# Patient Record
Sex: Female | Born: 1990 | Hispanic: Yes | Marital: Single | Smoking: Former smoker
Health system: Southern US, Community
[De-identification: ages and names within clinical notes are randomized; demographics above are authoritative.]

## PROBLEM LIST (undated history)

## (undated) ENCOUNTER — Inpatient Hospital Stay (HOSPITAL_COMMUNITY): Payer: Self-pay

## (undated) DIAGNOSIS — R87629 Unspecified abnormal cytological findings in specimens from vagina: Secondary | ICD-10-CM

## (undated) DIAGNOSIS — D649 Anemia, unspecified: Secondary | ICD-10-CM

## (undated) DIAGNOSIS — Z550 Illiteracy and low-level literacy: Secondary | ICD-10-CM

## (undated) DIAGNOSIS — Z559 Problems related to education and literacy, unspecified: Secondary | ICD-10-CM

## (undated) HISTORY — PX: NO PAST SURGERIES: SHX2092

## (undated) HISTORY — DX: Unspecified abnormal cytological findings in specimens from vagina: R87.629

## (undated) HISTORY — DX: Anemia, unspecified: D64.9

## (undated) HISTORY — DX: Problems related to education and literacy, unspecified: Z55.9

## (undated) HISTORY — DX: Illiteracy and low-level literacy: Z55.0

---

## 2012-10-04 NOTE — L&D Delivery Note (Signed)
Delivery Note At 3:14 AM a viable female was delivered via Vaginal, Spontaneous Delivery, cephalic presentation. Membranes spontaneously ruptured with delivery- clear fluid. APGAR: 9,9, weight pending Placenta status: delivered intact. 3 vessel Cord. With no complications: .Cord pH- not collected.  Anesthesia:  n/a Episiotomy: n/a Lacerations: none Suture Repair: n/a Est. Blood Loss (mL): 250  Mom to postpartum.  Baby to nursery-stable.  Anselm Lis 07/14/2013, 3:39 AM  I have seen and examined this patient and I agree with the above. Was present for delivery. Alexandr Oehler 4:18 AM 07/14/2013

## 2012-12-20 ENCOUNTER — Encounter (HOSPITAL_COMMUNITY): Payer: Self-pay

## 2012-12-20 ENCOUNTER — Emergency Department (HOSPITAL_COMMUNITY): Payer: Self-pay

## 2012-12-20 ENCOUNTER — Emergency Department (HOSPITAL_COMMUNITY)
Admission: EM | Admit: 2012-12-20 | Discharge: 2012-12-20 | Disposition: A | Discharge status: Home / Self Care | Payer: Self-pay | Attending: Emergency Medicine | Admitting: Emergency Medicine

## 2012-12-20 DIAGNOSIS — O210 Mild hyperemesis gravidarum: Secondary | ICD-10-CM | POA: Insufficient documentation

## 2012-12-20 DIAGNOSIS — E860 Dehydration: Secondary | ICD-10-CM | POA: Insufficient documentation

## 2012-12-20 DIAGNOSIS — R109 Unspecified abdominal pain: Secondary | ICD-10-CM | POA: Insufficient documentation

## 2012-12-20 DIAGNOSIS — N760 Acute vaginitis: Secondary | ICD-10-CM | POA: Insufficient documentation

## 2012-12-20 DIAGNOSIS — R51 Headache: Secondary | ICD-10-CM | POA: Insufficient documentation

## 2012-12-20 DIAGNOSIS — O9989 Other specified diseases and conditions complicating pregnancy, childbirth and the puerperium: Secondary | ICD-10-CM | POA: Insufficient documentation

## 2012-12-20 DIAGNOSIS — R509 Fever, unspecified: Secondary | ICD-10-CM | POA: Insufficient documentation

## 2012-12-20 DIAGNOSIS — O98819 Other maternal infectious and parasitic diseases complicating pregnancy, unspecified trimester: Secondary | ICD-10-CM | POA: Insufficient documentation

## 2012-12-20 DIAGNOSIS — R112 Nausea with vomiting, unspecified: Secondary | ICD-10-CM

## 2012-12-20 LAB — WET PREP, GENITAL: Trich, Wet Prep: NONE SEEN

## 2012-12-20 LAB — URINALYSIS, ROUTINE W REFLEX MICROSCOPIC
Ketones, ur: >80 mg/dL — AB
Nitrite: NEGATIVE
Specific Gravity, Urine: 1.027 (ref 1.005–1.030)
pH: 6 (ref 5.0–8.0)

## 2012-12-20 LAB — CBC WITH DIFFERENTIAL/PLATELET
Basophils Absolute: 0 10*3/uL (ref 0.0–0.1)
Basophils Relative: 0 % (ref 0–1)
Hemoglobin: 11.6 g/dL — ABNORMAL LOW (ref 12.0–15.0)
MCHC: 33.9 g/dL (ref 30.0–36.0)
Monocytes Relative: 6 % (ref 3–12)
Neutro Abs: 7.5 10*3/uL (ref 1.7–7.7)
Neutrophils Relative %: 72 % (ref 43–77)
RDW: 18.3 % — ABNORMAL HIGH (ref 11.5–15.5)
WBC: 10.4 10*3/uL (ref 4.0–10.5)

## 2012-12-20 LAB — BASIC METABOLIC PANEL
CO2: 21 mEq/L (ref 19–32)
Calcium: 9 mg/dL (ref 8.4–10.5)
Creatinine, Ser: 0.41 mg/dL — ABNORMAL LOW (ref 0.50–1.10)
Glucose, Bld: 90 mg/dL (ref 70–99)

## 2012-12-20 LAB — URINE MICROSCOPIC-ADD ON

## 2012-12-20 MED ORDER — ACETAMINOPHEN 325 MG PO TABS
650.0000 mg | ORAL_TABLET | Freq: Once | ORAL | Status: AC
  Administered 2012-12-20: 650 mg via ORAL
  Filled 2012-12-20: qty 2

## 2012-12-20 MED ORDER — ONDANSETRON 4 MG PO TBDP
8.0000 mg | ORAL_TABLET | Freq: Once | ORAL | Status: AC
  Administered 2012-12-20: 8 mg via ORAL
  Filled 2012-12-20: qty 2

## 2012-12-20 MED ORDER — METRONIDAZOLE 500 MG PO TABS
500.0000 mg | ORAL_TABLET | Freq: Once | ORAL | Status: AC
  Administered 2012-12-20: 500 mg via ORAL
  Filled 2012-12-20: qty 1

## 2012-12-20 MED ORDER — ONDANSETRON 8 MG PO TBDP: 8.0000 mg | ORAL_TABLET | Freq: Three times a day (TID) | ORAL | Status: DC | PRN

## 2012-12-20 MED ORDER — METRONIDAZOLE 500 MG PO TABS: 500.0000 mg | ORAL_TABLET | Freq: Two times a day (BID) | ORAL | Status: DC

## 2012-12-20 NOTE — ED Notes (Signed)
Patient presents with c/o mid abdominal pain, nausea, vomiting, headache and fever since 5 pm this afternoon. Patient reports being about 3 months pregnant (G2P1). Also reports some mild vaginal bleeding (spotting) 1 day ago but none today.  Patient has not had any prenatal care but has an appointment on 3/31. She is taking prenatal vitamins. Patient does not speak [place]. Primary language Spanish. Family translating.

## 2012-12-20 NOTE — ED Provider Notes (Signed)
History     CSN: [id number]  Arrival date & time 12/20/12  9147   First MD Initiated Contact with Patient 12/20/12 0245      Chief Complaint  Patient presents with  . Abdominal Pain  . Emesis During Pregnancy    (Consider location/radiation/quality/duration/timing/severity/associated sxs/prior treatment) HPI 22 yo female with c/o lower abdominal pain, subjective fevers, n/v and headache since 5 pm.  No dysuria.  Pt is G2P1 at 11wk5d by LMP.  Pt has f/u with OB for first visit on 3/31.  No problems with prior pregnancy.  Some spotting yesterday when wiping, but none on underwear.  Pain is dull, constant.  No diarrhea.  No sick contacts.    History reviewed. No pertinent past medical history.  History reviewed. No pertinent past surgical history.  No family history on file.  History  Substance Use Topics  . Smoking status: Never Smoker   . Smokeless tobacco: Never Used  . Alcohol Use: No    OB History   Grav Para Term Preterm Abortions TAB SAB Ect Mult Living   2 1        1       Review of Systems  Unable to perform ROS: Other  language barrier  Allergies  Review of patient's allergies indicates no known allergies.  Home Medications   Current Outpatient Rx  Name  Route  Sig  Dispense  Refill  . ferrous fumarate (HEMOCYTE - 106 MG FE) 325 (106 FE) MG TABS   Oral   Take 1 tablet by mouth daily.           BP 109/64  Pulse 75  Temp(Src) 99 F (37.2 C) (Oral)  Resp 16  SpO2 100%  LMP 09/29/2012  Physical Exam  Nursing note and vitals reviewed. Constitutional: She is oriented to person, place, and time. She appears well-developed and well-nourished. She appears distressed.  HENT:  Head: Normocephalic and atraumatic.  Right Ear: External ear normal.  Left Ear: External ear normal.  Nose: Nose normal.  Dry mucous membranes  Eyes: Conjunctivae and EOM are normal. Pupils are equal, round, and reactive to light.  Neck: Normal range of motion. Neck supple.  No JVD present. No tracheal deviation present. No thyromegaly present.  Cardiovascular: Normal rate, regular rhythm, normal heart sounds and intact distal pulses.  Exam reveals no gallop and no friction rub.   No murmur heard. Pulmonary/Chest: Effort normal and breath sounds normal. No stridor. No respiratory distress. She has no wheezes. She has no rales. She exhibits no tenderness.  Abdominal: Soft. Bowel sounds are normal. She exhibits no distension and no mass. There is tenderness (mild ttp in suprapubic region). There is no rebound and no guarding.  Genitourinary:  External genitalia normal Vagina with foamy yellow discharge Cervix closed no lesions No cervical motion tenderness Adnexa palpated,  Bladder palpated  Uterus palpated, gravid c/w dates  Diffuse tenderness throughout pelvic, slightly worse on right    Musculoskeletal: Normal range of motion. She exhibits no edema and no tenderness.  Lymphadenopathy:    She has no cervical adenopathy.  Neurological: She is alert and oriented to person, place, and time. She exhibits normal muscle tone. Coordination normal.  Skin: Skin is warm and dry. No rash noted. No erythema. No pallor.  Psychiatric: She has a normal mood and affect. Her behavior is normal. Judgment and thought content normal.    ED Course  Procedures (including critical care time)  Labs Reviewed  WET PREP, GENITAL - Abnormal;  Notable for the following:    Clue Cells Wet Prep HPF POC MODERATE (*)    WBC, Wet Prep HPF POC TOO NUMEROUS TO COUNT (*)    All other components within normal limits  URINALYSIS, ROUTINE W REFLEX MICROSCOPIC - Abnormal; Notable for the following:    APPearance CLOUDY (*)    Ketones, ur >80 (*)    Leukocytes, UA MODERATE (*)    All other components within normal limits  CBC WITH DIFFERENTIAL - Abnormal; Notable for the following:    Hemoglobin 11.6 (*)    HCT 34.2 (*)    RDW 18.3 (*)    All other components within normal limits  BASIC  METABOLIC PANEL - Abnormal; Notable for the following:    Potassium 3.4 (*)    Creatinine, Ser 0.41 (*)    All other components within normal limits  URINE MICROSCOPIC-ADD ON - Abnormal; Notable for the following:    Squamous Epithelial / LPF MANY (*)    Bacteria, UA MANY (*)    All other components within normal limits  GC/CHLAMYDIA PROBE AMP  URINE CULTURE   US Ob Comp Less 14 Wks  12/20/2012  *RADIOLOGY REPORT*  Clinical Data: Abdominal pain.  Estimated gestational age by LMP is 11 weeks 5 days.  Quantitative beta HCG is pending.  OBSTETRIC <14 WK [place] AND TRANSVAGINAL OB US  Technique:  Both transabdominal and transvaginal ultrasound examinations were performed for complete evaluation of the gestation as well as the maternal uterus, adnexal regions, and pelvic cul-de-sac.  Transvaginal technique was performed to assess early pregnancy.  Comparison:  None.  Intrauterine gestational sac:  A single intrauterine pregnancy is identified. Yolk sac: The yolk sac is visualized. Embryo: The fetal pole is visualized. Cardiac Activity: Fetal cardiac activity is observed. Heart Rate: 163 bpm  CRL: 37.7  mm  10 w  5 d            [place] EDC: 07/13/2013  Maternal uterus/adnexae: The uterus is mildly anteverted.  No subchorionic hemorrhage. Right ovary is not visualized.  Left ovary measures 2.8 x 1.4 x 2 cm.  No abnormal adnexal masses.  Minimal free fluid in the pelvis.  IMPRESSION: Single intrauterine pregnancy.  Estimated gestational age by crown- rump length is 10 weeks 5 days.  No specific complication is identified.   Original Report Authenticated By: Burman Nieves, M.D.    US Ob Transvaginal  12/20/2012  *RADIOLOGY REPORT*  Clinical Data: Abdominal pain.  Estimated gestational age by LMP is 11 weeks 5 days.  Quantitative beta HCG is pending.  OBSTETRIC <14 WK [place] AND TRANSVAGINAL OB US  Technique:  Both transabdominal and transvaginal ultrasound examinations were performed for complete evaluation of the  gestation as well as the maternal uterus, adnexal regions, and pelvic cul-de-sac.  Transvaginal technique was performed to assess early pregnancy.  Comparison:  None.  Intrauterine gestational sac:  A single intrauterine pregnancy is identified. Yolk sac: The yolk sac is visualized. Embryo: The fetal pole is visualized. Cardiac Activity: Fetal cardiac activity is observed. Heart Rate: 163 bpm  CRL: 37.7  mm  10 w  5 d            [place] EDC: 07/13/2013  Maternal uterus/adnexae: The uterus is mildly anteverted.  No subchorionic hemorrhage. Right ovary is not visualized.  Left ovary measures 2.8 x 1.4 x 2 cm.  No abnormal adnexal masses.  Minimal free fluid in the pelvis.  IMPRESSION: Single intrauterine pregnancy.  Estimated gestational age by  crown- rump length is 10 weeks 5 days.  No specific complication is identified.   Original Report Authenticated By: Burman Nieves, M.D.      1. Bacterial vaginosis   2. Nausea and vomiting   3. Headache   4. Dehydration       MDM  22 yo female with headache, n/v, lower abd pain.  Possible UTI, noted to have +ketones in urine.  Nausea better after zofran.  U/s pelvis pending.        Olivia Mackie, MD 12/20/12 [phone]

## 2012-12-20 NOTE — ED Notes (Signed)
Patient transported to Ultrasound 

## 2012-12-20 NOTE — ED Notes (Signed)
Dr. Norlene Campbell in with patient for a pelvic exam.

## 2012-12-22 LAB — URINE CULTURE: Colony Count: >=100000

## 2012-12-23 ENCOUNTER — Telehealth (HOSPITAL_COMMUNITY): Payer: Self-pay | Admitting: Emergency Medicine

## 2012-12-23 NOTE — ED Notes (Signed)
Patient has +Chlamydia. Checking to see if appropriately treated.

## 2012-12-23 NOTE — ED Notes (Signed)
+  Chlamydia. Chart sent to EDP office for review. [employer]HS attached. 

## 2012-12-23 NOTE — ED Notes (Signed)
Patient has +Urine culture. Checking to see if appropriately treated. °

## 2012-12-23 NOTE — ED Notes (Signed)
+   Urine Chart sent to EDP office for review. 

## 2012-12-25 NOTE — ED Notes (Signed)
Chart returned from EDP office.Positive chlamydia result patient needs Azithromycin disp 1 refill 0 Positive UTI  coli  Patient needs .Nitrofurantoin 50 mg take four times a day per .JLP.PAC

## 2012-12-27 ENCOUNTER — Telehealth (HOSPITAL_COMMUNITY): Payer: Self-pay | Admitting: Emergency Medicine

## 2012-12-27 NOTE — ED Notes (Signed)
EPIC # incorrect.  Letter sent to [facility] address

## 2013-02-03 ENCOUNTER — Telehealth (HOSPITAL_COMMUNITY): Payer: Self-pay | Admitting: Emergency Medicine

## 2013-02-03 NOTE — ED Notes (Signed)
No response to letter sent after 30 days. Chart sent to Medical Records. [employer]HS faxed. °

## 2013-02-08 LAB — OB RESULTS CONSOLE RUBELLA ANTIBODY, IGM: Rubella: IMMUNE

## 2013-02-08 LAB — OB RESULTS CONSOLE HEPATITIS B SURFACE ANTIGEN: Hepatitis B Surface Ag: NEGATIVE

## 2013-02-08 LAB — OB RESULTS CONSOLE RPR: RPR: NONREACTIVE

## 2013-03-03 ENCOUNTER — Observation Stay (HOSPITAL_COMMUNITY)
Admission: EM | Admit: 2013-03-03 | Discharge: 2013-03-04 | Disposition: A | Discharge status: Home / Self Care | Payer: Medicaid Other | Attending: Family Medicine | Admitting: Family Medicine

## 2013-03-03 ENCOUNTER — Encounter (HOSPITAL_COMMUNITY): Payer: Self-pay | Admitting: Emergency Medicine

## 2013-03-03 DIAGNOSIS — Z3490 Encounter for supervision of normal pregnancy, unspecified, unspecified trimester: Secondary | ICD-10-CM

## 2013-03-03 DIAGNOSIS — R202 Paresthesia of skin: Secondary | ICD-10-CM

## 2013-03-03 DIAGNOSIS — R209 Unspecified disturbances of skin sensation: Secondary | ICD-10-CM | POA: Insufficient documentation

## 2013-03-03 DIAGNOSIS — G43109 Migraine with aura, not intractable, without status migrainosus: Secondary | ICD-10-CM

## 2013-03-03 DIAGNOSIS — O99891 Other specified diseases and conditions complicating pregnancy: Principal | ICD-10-CM | POA: Insufficient documentation

## 2013-03-03 DIAGNOSIS — R200 Anesthesia of skin: Secondary | ICD-10-CM

## 2013-03-03 DIAGNOSIS — E876 Hypokalemia: Secondary | ICD-10-CM

## 2013-03-03 DIAGNOSIS — G43909 Migraine, unspecified, not intractable, without status migrainosus: Secondary | ICD-10-CM | POA: Diagnosis present

## 2013-03-03 LAB — POCT I-STAT, CHEM 8
Creatinine, Ser: 0.6 mg/dL (ref 0.50–1.10)
HCT: 36 % (ref 36.0–46.0)
Hemoglobin: 12.2 g/dL (ref 12.0–15.0)
Potassium: 3.1 mEq/L — ABNORMAL LOW (ref 3.5–5.1)
Sodium: 140 mEq/L (ref 135–145)

## 2013-03-03 LAB — CBC WITH DIFFERENTIAL/PLATELET
Basophils Absolute: 0 10*3/uL (ref 0.0–0.1)
Eosinophils Relative: 0 % (ref 0–5)
Lymphocytes Relative: 28 % (ref 12–46)
Neutro Abs: 5.2 10*3/uL (ref 1.7–7.7)
Neutrophils Relative %: 64 % (ref 43–77)
Platelets: 182 10*3/uL (ref 150–400)
RBC: 3.46 MIL/uL — ABNORMAL LOW (ref 3.87–5.11)
RDW: 16.8 % — ABNORMAL HIGH (ref 11.5–15.5)
WBC: 8.1 10*3/uL (ref 4.0–10.5)

## 2013-03-03 LAB — COMPREHENSIVE METABOLIC PANEL
ALT: 10 U/L (ref 0–35)
AST: 14 U/L (ref 0–37)
Alkaline Phosphatase: 51 U/L (ref 39–117)
CO2: 21 mEq/L (ref 19–32)
Calcium: 7.9 mg/dL — ABNORMAL LOW (ref 8.4–10.5)
Chloride: 107 mEq/L (ref 96–112)
GFR calc non Af Amer: >90 mL/min (ref >90)
Potassium: 3.2 mEq/L — ABNORMAL LOW (ref 3.5–5.1)
Sodium: 138 mEq/L (ref 135–145)

## 2013-03-03 LAB — CBC
HCT: 35.2 % — ABNORMAL LOW (ref 36.0–46.0)
MCHC: 34.1 g/dL (ref 30.0–36.0)
MCV: 88.4 fL (ref 78.0–100.0)
RDW: 16.7 % — ABNORMAL HIGH (ref 11.5–15.5)

## 2013-03-03 LAB — URINALYSIS, ROUTINE W REFLEX MICROSCOPIC
Glucose, UA: NEGATIVE mg/dL
Hgb urine dipstick: NEGATIVE
Ketones, ur: NEGATIVE mg/dL
Protein, ur: NEGATIVE mg/dL

## 2013-03-03 LAB — PROTIME-INR: INR: 1.11 (ref 0.00–1.49)

## 2013-03-03 LAB — POCT PREGNANCY, URINE: Preg Test, Ur: POSITIVE — AB

## 2013-03-03 LAB — URINE MICROSCOPIC-ADD ON

## 2013-03-03 MED ORDER — DIPHENHYDRAMINE HCL 50 MG/ML IJ SOLN
25.0000 mg | Freq: Once | INTRAMUSCULAR | Status: AC
  Administered 2013-03-03: 25 mg via INTRAVENOUS
  Filled 2013-03-03: qty 1

## 2013-03-03 MED ORDER — METOCLOPRAMIDE HCL 5 MG/ML IJ SOLN
10.0000 mg | Freq: Once | INTRAMUSCULAR | Status: AC
  Administered 2013-03-03: 10 mg via INTRAVENOUS
  Filled 2013-03-03: qty 2

## 2013-03-03 MED ORDER — POTASSIUM CHLORIDE CRYS ER 20 MEQ PO TBCR
40.0000 meq | EXTENDED_RELEASE_TABLET | Freq: Once | ORAL | Status: AC
  Administered 2013-03-04: 40 meq via ORAL
  Filled 2013-03-03: qty 2

## 2013-03-03 MED ORDER — SODIUM CHLORIDE 0.9 % IV BOLUS (SEPSIS)
1000.0000 mL | Freq: Once | INTRAVENOUS | Status: AC
  Administered 2013-03-03: 1000 mL via INTRAVENOUS

## 2013-03-03 NOTE — ED Notes (Addendum)
Patient with headache, neck pain.  Patient is tearful in triage.  Patient states she has nausea and vomiting.  Patient is 5 months pregnant.  LMP: 10/30/12

## 2013-03-03 NOTE — ED Provider Notes (Signed)
History     CSN: [id number]  Arrival date & time 03/03/13  4696   First MD Initiated Contact with Patient 03/03/13 2001      Chief Complaint  Patient presents with  . Headache    (Consider location/radiation/quality/duration/timing/severity/associated sxs/prior treatment) HPI  Pt speaks Spanish.   Pt is G2P1Ab0, [facility] 07/06/2013. Patient is here with her own interpreter. She relates she has had a headache the last couple days and states the headache is diffuse. She relates that headache has been bad at times. She indicates it's in her forehead and in her temples. She describes the headache as a pressure and it hurts with certain movements. She reports today about 6 PM she had acute onset of numbness in her left arm and her left leg. She was able to walk without stumbling. She denies any blurred vision but states she did see some spots. She denies seeing flashing lights. She states her headache is improved now. She does feel dizzy. She denies any known injury. She denies fever. She relates she had a similar episode 3 years ago and was told she possibly was going to have a stroke. She reports headache with left-sided numbness during that episode also.  At that time she was not on birth control pills. She denies having prior CT scan or MRI done in the past. She states she had a sore throat yesterday. She has had some rhinorrhea and sneezing. She denies cough, chest pain, she does feel short of breath at times. She denies any abdominal pain, vaginal bleeding, dysuria, requested, urgency. She states she is currently having a headache. Her cousin is with her and states patient is under a lot of stress. The father of her baby is not interested in her pregnancy being involved with the baby. She also works long shifts at  work, 12 hour shifts.  [facility]  History reviewed. No pertinent past medical history.  History reviewed. No pertinent past surgical history.  History reviewed.  No pertinent family history.  History  Substance Use Topics  . Smoking status: Former Games developer  . Smokeless tobacco: Never Used  . Alcohol Use: No  employed  OB History   Grav Para Term Preterm Abortions TAB SAB Ect Mult Living   2 1        1       Review of Systems  All other systems reviewed and are negative.    Allergies  Review of patient's allergies indicates no known allergies.  Home Medications   Current Outpatient Rx  Name  Route  Sig  Dispense  Refill  . ferrous fumarate (HEMOCYTE - 106 MG FE) 325 (106 FE) MG TABS   Oral   Take 1 tablet by mouth daily.           BP 115/70  Pulse 90  Temp(Src) 98.4 F (36.9 C) (Oral)  Resp 18  SpO2 99%  LMP 10/30/2012  Vital signs normal    Physical Exam  Nursing note and vitals reviewed. Constitutional: She is oriented to person, place, and time. She appears well-developed and well-nourished.  Non-toxic appearance. She does not appear ill. No distress.  HENT:  Head: Normocephalic and atraumatic.  Right Ear: External ear normal.  Left Ear: External ear normal.  Nose: Nose normal. No mucosal edema or rhinorrhea.  Mouth/Throat: Oropharynx is clear and moist and mucous membranes are normal. No dental abscesses or edematous.  Eyes: Conjunctivae and EOM are normal. Pupils are equal, round, and reactive to  light.  Neck: Normal range of motion and full passive range of motion without pain. Neck supple.  Cardiovascular: Normal rate, regular rhythm and normal heart sounds.  Exam reveals no gallop and no friction rub.   No murmur heard. Pulmonary/Chest: Effort normal and breath sounds normal. No respiratory distress. She has no wheezes. She has no rhonchi. She has no rales. She exhibits no tenderness and no crepitus.  Abdominal: Soft. Normal appearance and bowel sounds are normal. She exhibits no distension. There is no tenderness. There is no rebound and no guarding.  Musculoskeletal: Normal range of motion. She exhibits no  edema and no tenderness.  Moves all extremities well.   Neurological: She is alert and oriented to person, place, and time. She has normal strength. No cranial nerve deficit.  Patient has no facial nerve deficit. She does not have pronator drift. Her left grip is slightly weaker than I would expect for a right-handed person. She has no motor weakness in her lower extremities. She does however notice a difference to light touch in her left arm and left leg compared to the right.  Skin: Skin is warm, dry and intact. No rash noted. No erythema. No pallor.  Psychiatric: She has a normal mood and affect. Her speech is normal and behavior is normal. Her mood appears not anxious.    ED Course  Procedures (including critical care time)  Medications  potassium chloride SA (K-DUR,KLOR-CON) CR tablet 40 mEq (not administered)  sodium chloride 0.9 % bolus 1,000 mL (0 mLs Intravenous Stopped 03/03/13 2217)  metoCLOPramide (REGLAN) injection 10 mg (10 mg Intravenous Given 03/03/13 2139)  diphenhydrAMINE (BENADRYL) injection 25 mg (25 mg Intravenous Given 03/03/13 2140)   Reglan is a category B for pregnancy.   FHR 148-153  21:31 Dr Roseanne Reno, neurology will see patient as consult  Pt seen by Dr Roseanne Reno, he feels she has had a complex migraine and should be admitted until her numbness resolves. Will need MRI in am.   22:54 Dr Erin Fulling, [facility], states patient has a nonviable pregnancy so there is no OB intervention at this time, also they do not have a MR at [facility]. She feels patient would be better served to stay here with hospitialists, states they can call with questions about medications.   23:25 pt states her headache is better, advised she is going to be admitted.   23:56 Dr Elvera Lennox, admit to tele, team 10  Results for orders placed during the hospital encounter of 03/03/13  CBC      Result Value Range   WBC 8.0  4.0 - 10.5 K/uL   RBC 3.98  3.87 - 5.11 MIL/uL    Hemoglobin 12.0  12.0 - 15.0 g/dL   HCT 40.9 (*) 81.1 - 91.4 %   MCV 88.4  78.0 - 100.0 fL   MCH 30.2  26.0 - 34.0 pg   MCHC 34.1  30.0 - 36.0 g/dL   RDW 78.2 (*) 95.6 - 21.3 %   Platelets 203  150 - 400 K/uL  SEDIMENTATION RATE      Result Value Range   Sed Rate 17  0 - 22 mm/hr  URINALYSIS, ROUTINE W REFLEX MICROSCOPIC      Result Value Range   Color, Urine YELLOW  YELLOW   APPearance CLEAR  CLEAR   Specific Gravity, Urine 1.015  1.005 - 1.030   pH 6.5  5.0 - 8.0   Glucose, UA NEGATIVE  NEGATIVE mg/dL   Hgb urine  dipstick NEGATIVE  NEGATIVE   Bilirubin Urine NEGATIVE  NEGATIVE   Ketones, ur NEGATIVE  NEGATIVE mg/dL   Protein, ur NEGATIVE  NEGATIVE mg/dL   Urobilinogen, UA 0.2  0.0 - 1.0 mg/dL   Nitrite NEGATIVE  NEGATIVE   Leukocytes, UA TRACE (*) NEGATIVE  URINE MICROSCOPIC-ADD ON      Result Value Range   WBC, UA 0-2  <3 WBC/hpf   Urine-Other AMORPHOUS URATES/PHOSPHATES    CBC WITH DIFFERENTIAL      Result Value Range   WBC 8.1  4.0 - 10.5 K/uL   RBC 3.46 (*) 3.87 - 5.11 MIL/uL   Hemoglobin 10.5 (*) 12.0 - 15.0 g/dL   HCT 21.3 (*) 08.6 - 57.8 %   MCV 87.9  78.0 - 100.0 fL   MCH 30.3  26.0 - 34.0 pg   MCHC 34.5  30.0 - 36.0 g/dL   RDW 46.9 (*) 62.9 - 52.8 %   Platelets 182  150 - 400 K/uL   Neutrophils Relative % 64  43 - 77 %   Neutro Abs 5.2  1.7 - 7.7 K/uL   Lymphocytes Relative 28  12 - 46 %   Lymphs Abs 2.3  0.7 - 4.0 K/uL   Monocytes Relative 8  3 - 12 %   Monocytes Absolute 0.7  0.1 - 1.0 K/uL   Eosinophils Relative 0  0 - 5 %   Eosinophils Absolute 0.0  0.0 - 0.7 K/uL   Basophils Relative 0  0 - 1 %   Basophils Absolute 0.0  0.0 - 0.1 K/uL  COMPREHENSIVE METABOLIC PANEL      Result Value Range   Sodium 138  135 - 145 mEq/L   Potassium 3.2 (*) 3.5 - 5.1 mEq/L   Chloride 107  96 - 112 mEq/L   CO2 21  19 - 32 mEq/L   Glucose, Bld 89  70 - 99 mg/dL   BUN 10  6 - 23 mg/dL   Creatinine, Ser 4.13  0.50 - 1.10 mg/dL   Calcium 7.9 (*) 8.4 - 10.5 mg/dL    Total Protein 5.6 (*) 6.0 - 8.3 g/dL   Albumin 2.5 (*) 3.5 - 5.2 g/dL   AST 14  0 - 37 U/L   ALT 10  0 - 35 U/L   Alkaline Phosphatase 51  39 - 117 U/L   Total Bilirubin 0.1 (*) 0.3 - 1.2 mg/dL   GFR calc non Af Amer >90  >90 mL/min   GFR calc Af Amer >90  >90 mL/min  APTT      Result Value Range   aPTT 32  24 - 37 seconds  PROTIME-INR      Result Value Range   Prothrombin Time 14.2  11.6 - 15.2 seconds   INR 1.11  0.00 - 1.49  POCT PREGNANCY, URINE      Result Value Range   Preg Test, Ur POSITIVE (*) NEGATIVE  POCT I-STAT, CHEM 8      Result Value Range   Sodium 140  135 - 145 mEq/L   Potassium 3.1 (*) 3.5 - 5.1 mEq/L   Chloride 108  96 - 112 mEq/L   BUN 8  6 - 23 mg/dL   Creatinine, Ser 2.44  0.50 - 1.10 mg/dL   Glucose, Bld 86  70 - 99 mg/dL   Calcium, Ion 0.10  2.72 - 1.23 mmol/L   TCO2 22  0 - 100 mmol/L   Hemoglobin 12.2  12.0 - 15.0 g/dL  HCT 36.0  36.0 - 46.0 %   Laboratory interpretation all normal except hypokalemia     1. Migraine headache   2. Numbness and tingling of left arm and leg   3. Complicated migraine   4. Numbness and tingling in left arm   5. Numbness and tingling of left leg   6. Pregnancy   7. Hypokalemia     Plan admission  Devoria Albe, MD, FACEP     MDM          Ward Givens, MD 03/04/13 [phone]

## 2013-03-03 NOTE — Consult Note (Signed)
NEURO HOSPITALIST CONSULT NOTE    Reason for Consult: Headache and left-sided numbness.  HPI:                                                                                                                                          Lisa Leach is an 22 y.o. female is currently 5 months pregnant, presenting with complaint of headache for 3 days and onset of numbness involving left face arm and leg at about 6 PM this evening. She has not experienced weakness. She's had no speech changes in her speech. She had a similar problem about 3 years ago which resolved without residual deficit. CT scan of her head was not performed. Routine laboratory studies are pending. Patient complained of nausea and vomiting as well. She was given Benadryl and Reglan. Obstetric services being consulted for consideration for admission for observation.  History reviewed. No pertinent past medical history.  History reviewed. No pertinent past surgical history.  History reviewed. No pertinent family history.   Social History:  reports that she has quit smoking. She has never used smokeless tobacco. She reports that she does not drink alcohol or use illicit drugs.  No Known Allergies  MEDICATIONS:                                                                                                                     I have reviewed the patient's current medications. Prior to Admission:    ROS:                                                                                                                                       History obtained from the patient (  with interpreter)  General ROS: negative for - chills, fatigue, fever, night sweats, weight gain or weight loss Psychological ROS: negative for - behavioral disorder, hallucinations, memory difficulties, mood swings or suicidal ideation Ophthalmic ROS: negative for - blurry vision, double vision, eye pain or loss of vision ENT ROS: negative  for - epistaxis, nasal discharge, oral lesions, sore throat, tinnitus or vertigo Allergy and Immunology ROS: negative for - hives or itchy/watery eyes Hematological and Lymphatic ROS: negative for - bleeding problems, bruising or swollen lymph nodes Endocrine ROS: negative for - galactorrhea, hair pattern changes, polydipsia/polyuria or temperature intolerance Respiratory ROS: negative for - cough, hemoptysis, shortness of breath or wheezing Cardiovascular ROS: negative for - chest pain, dyspnea on exertion, edema or irregular heartbeat Gastrointestinal ROS: negative for - abdominal pain, diarrhea, hematemesis, nausea/vomiting or stool incontinence Genito-Urinary ROS: negative for - dysuria, hematuria, incontinence or urinary frequency/urgency Musculoskeletal ROS: negative for - joint swelling or muscular weakness Neurological ROS: as noted in HPI Dermatological ROS: negative for rash and skin lesion changes   Blood pressure 115/70, pulse 90, temperature 98.4 F (36.9 C), temperature source Oral, resp. rate 18, last menstrual period 10/30/2012, SpO2 100.00%.   Neurologic Examination:                                                                                                      Mental Status: Alert, oriented, thought content appropriate.  Speech fluent without evidence of aphasia. Able to follow commands without difficulty. Cranial Nerves: II-Visual fields were normal. III/IV/VI-Pupils were equal and reacted. Extraocular movements were full and conjugate.    V/VII-no facial numbness and no facial weakness. VIII-normal. X-normal speech and symmetrical palatal movement. Motor: 5/5 bilaterally with normal tone and bulk Sensory: Reduced perception of tactile sensation and vibratory sensation over left extremities compared to right extremities. Deep Tendon Reflexes: 2+ and symmetric. Plantars: Flexor bilaterally Cerebellar: Normal finger-to-nose testing. Carotid auscultation:  Normal  No results found for this basename: cbc, bmp, coags, chol, tri, ldl, hga1c    Results for orders placed during the hospital encounter of 03/03/13 (from the past 48 hour(s))  CBC     Status: Abnormal   Collection Time    03/03/13  7:24 PM      Result Value Range   WBC 8.0  4.0 - 10.5 K/uL   RBC 3.98  3.87 - 5.11 MIL/uL   Hemoglobin 12.0  12.0 - 15.0 g/dL   HCT 16.1 (*) 09.6 - 04.5 %   MCV 88.4  78.0 - 100.0 fL   MCH 30.2  26.0 - 34.0 pg   MCHC 34.1  30.0 - 36.0 g/dL   RDW 40.9 (*) 81.1 - 91.4 %   Platelets 203  150 - 400 K/uL  POCT PREGNANCY, URINE     Status: Abnormal   Collection Time    03/03/13  7:32 PM      Result Value Range   Preg Test, Ur POSITIVE (*) NEGATIVE   Comment:            THE SENSITIVITY OF THIS  METHODOLOGY IS >24 mIU/mL  URINALYSIS, ROUTINE W REFLEX MICROSCOPIC     Status: Abnormal   Collection Time    03/03/13  7:39 PM      Result Value Range   Color, Urine YELLOW  YELLOW   APPearance CLEAR  CLEAR   Specific Gravity, Urine 1.015  1.005 - 1.030   pH 6.5  5.0 - 8.0   Glucose, UA NEGATIVE  NEGATIVE mg/dL   Hgb urine dipstick NEGATIVE  NEGATIVE   Bilirubin Urine NEGATIVE  NEGATIVE   Ketones, ur NEGATIVE  NEGATIVE mg/dL   Protein, ur NEGATIVE  NEGATIVE mg/dL   Urobilinogen, UA 0.2  0.0 - 1.0 mg/dL   Nitrite NEGATIVE  NEGATIVE   Leukocytes, UA TRACE (*) NEGATIVE  URINE MICROSCOPIC-ADD ON     Status: None   Collection Time    03/03/13  7:39 PM      Result Value Range   WBC, UA 0-2  <3 WBC/hpf   Urine-Other AMORPHOUS URATES/PHOSPHATES    POCT I-STAT, CHEM 8     Status: Abnormal   Collection Time    03/03/13  8:05 PM      Result Value Range   Sodium 140  135 - 145 mEq/L   Potassium 3.1 (*) 3.5 - 5.1 mEq/L   Chloride 108  96 - 112 mEq/L   BUN 8  6 - 23 mg/dL   Creatinine, Ser 1.61  0.50 - 1.10 mg/dL   Glucose, Bld 86  70 - 99 mg/dL   Calcium, Ion 0.96  0.45 - 1.23 mmol/L   TCO2 22  0 - 100 mmol/L   Hemoglobin 12.2  12.0 - 15.0 g/dL    HCT 40.9  81.1 - 91.4 %  CBC WITH DIFFERENTIAL     Status: Abnormal   Collection Time    03/03/13  9:46 PM      Result Value Range   WBC 8.1  4.0 - 10.5 K/uL   RBC 3.46 (*) 3.87 - 5.11 MIL/uL   Hemoglobin 10.5 (*) 12.0 - 15.0 g/dL   HCT 78.2 (*) 95.6 - 21.3 %   MCV 87.9  78.0 - 100.0 fL   MCH 30.3  26.0 - 34.0 pg   MCHC 34.5  30.0 - 36.0 g/dL   RDW 08.6 (*) 57.8 - 46.9 %   Platelets 182  150 - 400 K/uL   Neutrophils Relative % 64  43 - 77 %   Neutro Abs 5.2  1.7 - 7.7 K/uL   Lymphocytes Relative 28  12 - 46 %   Lymphs Abs 2.3  0.7 - 4.0 K/uL   Monocytes Relative 8  3 - 12 %   Monocytes Absolute 0.7  0.1 - 1.0 K/uL   Eosinophils Relative 0  0 - 5 %   Eosinophils Absolute 0.0  0.0 - 0.7 K/uL   Basophils Relative 0  0 - 1 %   Basophils Absolute 0.0  0.0 - 0.1 K/uL    No results found.   Assessment/Plan: Probable recurrent complicated migraine headache. Current symptoms or essentially the same as she experienced 3 years ago. Stroke is unlikely, as patient has no known risk factors.  Recommend: overnight observation admission. CT scan of the head without contrast tonight if not contraindicated per obstetrics service. If numbness persists recommend MRI study of the brain without contrast in the a.m. to rule out possible stroke.  Trial of Fioricet for pain 1 every 4 hours when necessary moderate to severe headache.  CR Roseanne Reno  M.D. [facility] [phone]  03/03/2013, 10:06 PM

## 2013-03-03 NOTE — ED Notes (Signed)
FHT= 148-153. Good movement.

## 2013-03-04 ENCOUNTER — Encounter (HOSPITAL_COMMUNITY): Payer: Self-pay | Admitting: *Deleted

## 2013-03-04 DIAGNOSIS — E876 Hypokalemia: Secondary | ICD-10-CM

## 2013-03-04 DIAGNOSIS — G43909 Migraine, unspecified, not intractable, without status migrainosus: Secondary | ICD-10-CM

## 2013-03-04 DIAGNOSIS — G43109 Migraine with aura, not intractable, without status migrainosus: Secondary | ICD-10-CM

## 2013-03-04 MED ORDER — PROMETHAZINE HCL 12.5 MG PO TABS: 12.5000 mg | ORAL_TABLET | Freq: Four times a day (QID) | ORAL | Status: DC | PRN

## 2013-03-04 MED ORDER — ACETAMINOPHEN 500 MG PO TABS
500.0000 mg | ORAL_TABLET | Freq: Four times a day (QID) | ORAL | Status: DC | PRN
Start: 2013-03-04 — End: 2015-02-04

## 2013-03-04 MED ORDER — SODIUM CHLORIDE 0.9 % IJ SOLN: 3.0000 mL | Freq: Two times a day (BID) | INTRAMUSCULAR | Status: DC

## 2013-03-04 MED ORDER — ONDANSETRON HCL 4 MG/2ML IJ SOLN: 4.0000 mg | Freq: Three times a day (TID) | INTRAMUSCULAR | Status: DC | PRN

## 2013-03-04 MED ORDER — SODIUM CHLORIDE 0.9 % IV SOLN: INTRAVENOUS | Status: DC

## 2013-03-04 NOTE — Progress Notes (Signed)
Vision symptoms have completely resolved. She does endorse that are positive symptoms (tingling) associated with this. This also occurred in the setting of headache. This is very similar to her previous episode 3 years ago.  Her symptoms have resolved.  Exam: Filed Vitals:   03/04/13 0548  BP: 92/51  Pulse: 73  Temp: 97.5 F (36.4 C)  Resp: 18   Awake, alert, interactive and appropriate Intact sensation to light touch and temperature 5/5 strength in upper and lower extremities Finger-nose-finger intact  Impression: 22 year old female with symptoms consistent with complicated migraine headache. The presence of positive symptoms, similar to previous event, occurrence with headache are all very reassuring that this is migraine and not something more sinister.  I do not feel further evaluation is necessary at this time with resolution of the symptoms, patient stable to discharge home from neurological perspective  She could take occasional Compazine or Phenergan as migraine abortive, but she should discuss this with her OB as far as frequency that could be allowed.  Neurology will sign off please call with further questions.

## 2013-03-04 NOTE — Discharge Summary (Signed)
Physician Discharge Summary  Lisa Leach ZOX:[id number] DOB: 1991-01-20 DOA: 03/03/2013  PCP: No primary provider on file.  Admit date: 03/03/2013 Discharge date: 03/04/2013  Time spent: 35 minutes  Recommendations for Outpatient Follow-up:  1. Repeat CBC, and get test of cure for Chlamydia diagnosed 12/20/2012 as well as second trimester labs and other workup at health [facility] 2. Can take Tylenol for migraines-Phenergan given for intractable migraines to abort-returns and other medications contraindicated 3. Patient encouraged to followup at health Department in one to 2 days   Discharge Diagnoses:  Active Problems:   Migraine headache   Numbness and tingling of left arm and leg   Discharge Condition: Good  Diet recommendation: Heart healthy low-salt  Filed Weights   03/04/13 0144  Weight: 61.3 kg (135 lb 2.3 oz)    History of present illness:  22 year old G2 P2 Hispanic female admitted with 3 days of headache new onset left facial arm negative numbness. No dysarthria or weakness otherwise and has a history of migraines. She has similar episode 3 years prior to admission. No imaging studies were performed the patient's headache resolved. Neurology saw patient and cleared her for discharge.  She's been feeling the baby move, has no nausea vomiting or vaginal bleeding currently, no fever no chills no other issues and is stable for discharge     Discharge Exam: Filed Vitals:   03/04/13 0033 03/04/13 0115 03/04/13 0144 03/04/13 0548  BP: 115/76 95/62 93/54  92/51  Pulse: 93  73 73  Temp:   97.9 F (36.6 C) 97.5 F (36.4 C)  TempSrc:   Oral Oral  Resp:   18 18  Weight:   61.3 kg (135 lb 2.3 oz)   SpO2: 98%  100% 100%   Alert pleasant oriented General: Extraocular movements intact no no icterus Cardiovascular: S1-S2 no murmur rub or gallop Respiratory: Clear  Discharge Instructions     Medication List    TAKE these medications       acetaminophen 500 MG tablet   Commonly known as:  TYLENOL  Take 1 tablet (500 mg total) by mouth every 6 (six) hours as needed for pain.     ferrous fumarate 325 (106 FE) MG Tabs  Commonly known as:  HEMOCYTE - 106 mg FE  Take 1 tablet by mouth daily.     promethazine 12.5 MG tablet  Commonly known as:  PHENERGAN  Take 1 tablet (12.5 mg total) by mouth every 6 (six) hours as needed for nausea.       No Known Allergies     Follow-up Information   Follow up with [facility] [place] In 2 days.   Contact information:   [address] [place] [address] [phone]       The results of significant diagnostics from this hospitalization (including imaging, microbiology, ancillary and laboratory) are listed below for reference.    Significant Diagnostic Studies: No results found.  Microbiology: No results found for this or any previous visit (from the past 240 hour(s)).   Labs: Basic Metabolic Panel:  Recent Labs Lab 03/03/13 2005 03/03/13 2146  NA 140 138  K 3.1* 3.2*  CL 108 107  CO2  --  21  GLUCOSE 86 89  BUN 8 10  CREATININE 0.60 0.50  CALCIUM  --  7.9*   Liver Function Tests:  Recent Labs Lab 03/03/13 2146  AST 14  ALT 10  ALKPHOS 51  BILITOT 0.1*  PROT 5.6*  ALBUMIN 2.5*   No results  found for this basename: LIPASE, AMYLASE,  in the last 168 hours No results found for this basename: AMMONIA,  in the last 168 hours CBC:  Recent Labs Lab 03/03/13 1924 03/03/13 2005 03/03/13 2146  WBC 8.0  --  8.1  NEUTROABS  --   --  5.2  HGB 12.0 12.2 10.5*  HCT 35.2* 36.0 30.4*  MCV 88.4  --  87.9  PLT 203  --  182   Cardiac Enzymes: No results found for this basename: CKTOTAL, CKMB, CKMBINDEX, TROPONINI,  in the last 168 hours BNP: BNP (last 3 results) No results found for this basename: PROBNP,  in the last 8760 hours CBG: No results found for this basename: GLUCAP,  in the last 168 hours     Signed:  Rhetta Mura  [facility] 03/04/2013, 8:08  AM

## 2013-03-04 NOTE — Progress Notes (Signed)
UR Completed Najeh Credit Graves-Bigelow, RN,BSN [phone]  

## 2013-03-04 NOTE — H&P (Signed)
[facility] History and Physical  Lisa Leach XBJ:[id number] DOB: Apr 20, 1991 DOA: 03/03/2013  Referring physician: Dr. Lynelle Doctor PCP: No primary provider on file.  Specialists: Neurology, Dr. Roseanne Reno, ObGyn (phone)  Chief Complaint: Headache and face numbness  HPI: Lisa Leach is a 22 y.o. female, Spanish speaking only (interpreter phone used) currently 5 months pregnant, presents with a chief complaint of 3 days long headache and new onset of left face left arm and left leg numbness at about 6 PM the evening prior to admission. He denies weakness denies dysarthria. She has a history of migraine headaches, which are occasionally relieved with Tylenol at home. She had a similar presentation about 3 years ago which resolved without any residual deficits. Neurology has been consulted by the ED physician, and feel like this is more consistent with her current complicated migraine headaches rather than stroke. By the time I evaluated the patient in the ED, she was comfortable, and stated that her numbness in her left face arm and leg are completely gone, and she feels at baseline. She endorses some nausea and vomiting, which is not new. She denies any chest pain, breathing difficulties, and this otherwise healthy.  Review of Systems: As per history of present illness, otherwise negative  History reviewed. No pertinent past medical history. History reviewed. No pertinent past surgical history.'  Social History:  reports that she has quit smoking. She has never used smokeless tobacco. She reports that she does not drink alcohol or use illicit drugs.  No Known Allergies  History reviewed. No pertinent family history.   Prior to Admission medications   Medication Sig Start Date End Date Taking? Authorizing Provider  ferrous fumarate (HEMOCYTE - 106 MG FE) 325 (106 FE) MG TABS Take 1 tablet by mouth daily.   Yes Historical Provider, MD   Physical Exam: Filed Vitals:   03/03/13 2215 03/03/13  2230 03/03/13 2245 03/03/13 2300  BP: 91/52 93/53 96/61  88/53  Pulse: 75 76 73 78  Temp:      TempSrc:      Resp:      SpO2: 100% 99% 99% 97%     General:  No apparent distress  Eyes: PERRL, EOMI, no scleral icterus  ENT: moist oropharynx  Neck: supple, no JVD  Cardiovascular: regular rate without MRG; 2+ peripheral pulses  Respiratory: CTA biL, good air movement without wheezing, rhonchi or crackled  Abdomen: soft, non tender to palpation, positive bowel sounds, no guarding, no rebound  Skin: no rashes  Musculoskeletal: no peripheral edema  Psychiatric: normal mood and affect  Neurologic: Nonfocal, sensation intact bilaterally  Labs on Admission:  Basic Metabolic Panel:  Recent Labs Lab 03/03/13 2005 03/03/13 2146  NA 140 138  K 3.1* 3.2*  CL 108 107  CO2  --  21  GLUCOSE 86 89  BUN 8 10  CREATININE 0.60 0.50  CALCIUM  --  7.9*   Liver Function Tests:  Recent Labs Lab 03/03/13 2146  AST 14  ALT 10  ALKPHOS 51  BILITOT 0.1*  PROT 5.6*  ALBUMIN 2.5*   CBC:  Recent Labs Lab 03/03/13 1924 03/03/13 2005 03/03/13 2146  WBC 8.0  --  8.1  NEUTROABS  --   --  5.2  HGB 12.0 12.2 10.5*  HCT 35.2* 36.0 30.4*  MCV 88.4  --  87.9  PLT 203  --  182   EKG: Independently reviewed.  Assessment/Plan Active Problems:   Migraine headache   Numbness and tingling of left arm and leg  Complicated migraine headache - Neurology has been consulted, appreciate input. There will followup tomorrow. Symptoms completely resolved on my evaluation. If symptoms return, we'll probably need to do MRI as per neurology recommendations. She received Benadryl and Reglan the ED with resolution of her headache as well. I talked to OB/GYN over the phone about medication options, and Fioricet is okay if needed, however we should avoid Fiorinal.  DVT prophylaxis - Early ambulation, SCDs  Code Status: Presumed Full  Family Communication: none  Disposition Plan:  observation  Time spent: 68  Costin M. Elvera Lennox, MD [facility] Pager [phone]  If 7PM-7AM, please contact night-coverage [email or web address] Password [facility] 03/04/2013, 12:23 AM

## 2013-03-06 ENCOUNTER — Telehealth (HOSPITAL_COMMUNITY): Payer: Self-pay | Admitting: Emergency Medicine

## 2013-03-06 NOTE — ED Notes (Signed)
Call from [facility] to see if pt rcvd tx for (+) chlamydia in March.  Informed had Rx to call in but unable to reach by phone and letter was sent with no response.

## 2013-06-14 ENCOUNTER — Other Ambulatory Visit (HOSPITAL_COMMUNITY): Payer: Self-pay | Admitting: Physician Assistant

## 2013-06-14 DIAGNOSIS — IMO0002 Reserved for concepts with insufficient information to code with codable children: Secondary | ICD-10-CM

## 2013-06-18 ENCOUNTER — Other Ambulatory Visit (HOSPITAL_COMMUNITY): Payer: Self-pay | Admitting: Physician Assistant

## 2013-06-18 ENCOUNTER — Ambulatory Visit (HOSPITAL_COMMUNITY): Admission: RE | Admit: 2013-06-18 | Payer: Medicaid Other | Source: Ambulatory Visit

## 2013-06-18 ENCOUNTER — Encounter (HOSPITAL_COMMUNITY): Payer: Self-pay

## 2013-06-18 ENCOUNTER — Ambulatory Visit (HOSPITAL_COMMUNITY)
Admission: RE | Admit: 2013-06-18 | Discharge: 2013-06-18 | Disposition: A | Discharge status: Home / Self Care | Payer: Medicaid Other | Source: Ambulatory Visit | Attending: Physician Assistant | Admitting: Physician Assistant

## 2013-06-18 DIAGNOSIS — O365990 Maternal care for other known or suspected poor fetal growth, unspecified trimester, not applicable or unspecified: Secondary | ICD-10-CM | POA: Insufficient documentation

## 2013-06-18 DIAGNOSIS — IMO0002 Reserved for concepts with insufficient information to code with codable children: Secondary | ICD-10-CM

## 2013-06-18 DIAGNOSIS — Z3689 Encounter for other specified antenatal screening: Secondary | ICD-10-CM | POA: Insufficient documentation

## 2013-06-21 LAB — OB RESULTS CONSOLE GC/CHLAMYDIA
Chlamydia: NEGATIVE
Gonorrhea: NEGATIVE

## 2013-07-07 ENCOUNTER — Inpatient Hospital Stay (HOSPITAL_COMMUNITY)
Admission: AD | Admit: 2013-07-07 | Discharge: 2013-07-08 | Disposition: A | Discharge status: Home / Self Care | Payer: Self-pay | Source: Ambulatory Visit | Attending: Obstetrics & Gynecology | Admitting: Obstetrics & Gynecology

## 2013-07-07 ENCOUNTER — Encounter (HOSPITAL_COMMUNITY): Payer: Self-pay | Admitting: *Deleted

## 2013-07-07 DIAGNOSIS — O479 False labor, unspecified: Secondary | ICD-10-CM | POA: Insufficient documentation

## 2013-07-07 NOTE — MAU Provider Note (Signed)
  History     CSN: [id number]  Arrival date and time: 07/07/13 2129   None     Chief Complaint  Patient presents with  . Contractions   HPI Lisa Leach is a 22 y.o. G23P1001 female @ 101w1d by 10.5wk u/s, who presents w/ report of uc's x few days, worse since 0200. Reports good fm, denies vb or lof.  Initiated pnc at [facility] in 1st trimester. Genetic screening neg, anatomy u/s normal, 1hr glucola 108, gbs neg. +Ch early pregnancy, treated and had neg POC.  Term SVD in 2009 in [place], baby weighed 6lb6oz.   OB History   Grav Para Term Preterm Abortions TAB SAB Ect Mult Living   2 1 1       1       History reviewed. No pertinent past medical history.  History reviewed. No pertinent past surgical history.  History reviewed. No pertinent family history.  History  Substance Use Topics  . Smoking status: Former Games developer  . Smokeless tobacco: Never Used  . Alcohol Use: No    Allergies: No Known Allergies  Prescriptions prior to admission  Medication Sig Dispense Refill  . ferrous fumarate (HEMOCYTE - 106 MG FE) 325 (106 FE) MG TABS Take 1 tablet by mouth daily.      Marland Kitchen acetaminophen (TYLENOL) 500 MG tablet Take 1 tablet (500 mg total) by mouth every 6 (six) hours as needed for pain.  30 tablet  0    Review of Systems  Constitutional: Negative.   HENT: Negative.   Eyes: Negative.   Respiratory: Negative.   Cardiovascular: Negative.   Gastrointestinal: Positive for vomiting (uc's).  Genitourinary: Negative.   Musculoskeletal: Positive for back pain (LBP).  Skin: Negative.   Neurological: Negative.   Endo/Heme/Allergies: Negative.   Psychiatric/Behavioral: Negative.    Physical Exam   Blood pressure 111/66, pulse 69, resp. rate 18, height 5\' 2"  (1.575 m), weight 64.683 kg (142 lb 9.6 oz), last menstrual period 10/30/2012.  Physical Exam  Constitutional: She is oriented to person, place, and time. She appears well-developed and well-nourished.  HENT:  Head:  Normocephalic.  Neck: Normal range of motion.  Cardiovascular: Normal rate and regular rhythm.   Respiratory: Effort normal and breath sounds normal.  GI: Soft. There is no tenderness.  gravid  Genitourinary:  SVE: 4/80/-2, vtx  Musculoskeletal: Normal range of motion.  Neurological: She is alert and oriented to person, place, and time. She has normal reflexes.  Skin: Skin is warm and dry.  Psychiatric: She has a normal mood and affect. Her behavior is normal. Judgment and thought content normal.   FHR: 140, mod variability, 15x15accels, no decels=Cat I UCs: 2-9 w/ UI  MAU Course  Procedures  NST SVE x 2 w/o change Lives away  Declined need for pain med Assessment and Plan  A:  [redacted]w[redacted]d SIUP  G2P1001   Cat I FHR  GBS neg  BH vs. early labor P:  D/C home  To keep appt at [facility] this week as scheduled  Reviewed labor s/s, fkc, reasons to return  To increase po hydration, rest, warm baths as needed    Marge Duncans 07/07/2013, 11:06 PM

## 2013-07-07 NOTE — MAU Note (Signed)
PT SAYS WITH FRIEND  AS INTERPRETER-  SHE GETS [facility]  ON [address]- AT CLINIC.   WAS SEEN LAST   ON Monday-   NO VE,  BUT WEEK  BEFORE-  WAS THINNING OUT.     DENIES HSV AND MRSA.

## 2013-07-07 NOTE — MAU Note (Signed)
contractons for few days but closer and more painful since 0200

## 2013-07-08 DIAGNOSIS — O479 False labor, unspecified: Secondary | ICD-10-CM

## 2013-07-14 ENCOUNTER — Inpatient Hospital Stay (HOSPITAL_COMMUNITY)
Admission: AD | Admit: 2013-07-14 | Discharge: 2013-07-15 | DRG: 775 | Disposition: A | Discharge status: Home / Self Care | Payer: Medicaid Other | Source: Ambulatory Visit | Attending: Obstetrics & Gynecology | Admitting: Obstetrics & Gynecology

## 2013-07-14 ENCOUNTER — Encounter (HOSPITAL_COMMUNITY): Payer: Self-pay | Admitting: *Deleted

## 2013-07-14 DIAGNOSIS — IMO0001 Reserved for inherently not codable concepts without codable children: Secondary | ICD-10-CM

## 2013-07-14 LAB — CBC
HCT: 36.8 % (ref 36.0–46.0)
HCT: 29.3 % — ABNORMAL LOW (ref 36.0–46.0)
Hemoglobin: 12.8 g/dL (ref 12.0–15.0)
MCH: 30.8 pg (ref 26.0–34.0)
MCHC: 34.8 g/dL (ref 30.0–36.0)
MCHC: 35.5 g/dL (ref 30.0–36.0)
MCV: 88.5 fL (ref 78.0–100.0)
MCV: 88.8 fL (ref 78.0–100.0)
Platelets: 191 10*3/uL (ref 150–400)
Platelets: 170 10*3/uL (ref 150–400)
RBC: 4.16 MIL/uL (ref 3.87–5.11)
RBC: 3.3 MIL/uL — ABNORMAL LOW (ref 3.87–5.11)
RDW: 13.4 % (ref 11.5–15.5)
RDW: 13.3 % (ref 11.5–15.5)
WBC: 9.8 10*3/uL (ref 4.0–10.5)

## 2013-07-14 LAB — ABO/RH: ABO/RH(D): A POS

## 2013-07-14 LAB — RPR: RPR Ser Ql: NONREACTIVE

## 2013-07-14 LAB — TYPE AND SCREEN: Antibody Screen: NEGATIVE

## 2013-07-14 MED ORDER — DIBUCAINE 1 % RE OINT: 1.0000 "application " | TOPICAL_OINTMENT | RECTAL | Status: DC | PRN

## 2013-07-14 MED ORDER — IBUPROFEN 600 MG PO TABS
600.0000 mg | ORAL_TABLET | Freq: Four times a day (QID) | ORAL | Status: DC
  Administered 2013-07-14 – 2013-07-15 (×5): 600 mg via ORAL
  Filled 2013-07-14 (×5): qty 1

## 2013-07-14 MED ORDER — LANOLIN HYDROUS EX OINT: TOPICAL_OINTMENT | CUTANEOUS | Status: DC | PRN

## 2013-07-14 MED ORDER — SENNOSIDES-DOCUSATE SODIUM 8.6-50 MG PO TABS
2.0000 | ORAL_TABLET | ORAL | Status: DC
  Administered 2013-07-14: 2 via ORAL
  Filled 2013-07-14: qty 2

## 2013-07-14 MED ORDER — OXYCODONE-ACETAMINOPHEN 5-325 MG PO TABS
1.0000 | ORAL_TABLET | ORAL | Status: DC | PRN
  Administered 2013-07-14 (×2): 1 via ORAL
  Filled 2013-07-14 (×3): qty 1

## 2013-07-14 MED ORDER — IBUPROFEN 600 MG PO TABS
600.0000 mg | ORAL_TABLET | Freq: Four times a day (QID) | ORAL | Status: DC | PRN
  Administered 2013-07-14: 600 mg via ORAL
  Filled 2013-07-14: qty 1

## 2013-07-14 MED ORDER — WITCH HAZEL-GLYCERIN EX PADS: 1.0000 "application " | MEDICATED_PAD | CUTANEOUS | Status: DC | PRN

## 2013-07-14 MED ORDER — ACETAMINOPHEN 325 MG PO TABS: 650.0000 mg | ORAL_TABLET | ORAL | Status: DC | PRN

## 2013-07-14 MED ORDER — ONDANSETRON HCL 4 MG/2ML IJ SOLN: 4.0000 mg | Freq: Four times a day (QID) | INTRAMUSCULAR | Status: DC | PRN

## 2013-07-14 MED ORDER — DIPHENHYDRAMINE HCL 25 MG PO CAPS: 25.0000 mg | ORAL_CAPSULE | Freq: Four times a day (QID) | ORAL | Status: DC | PRN

## 2013-07-14 MED ORDER — ZOLPIDEM TARTRATE 5 MG PO TABS: 5.0000 mg | ORAL_TABLET | Freq: Every evening | ORAL | Status: DC | PRN

## 2013-07-14 MED ORDER — OXYCODONE-ACETAMINOPHEN 5-325 MG PO TABS
1.0000 | ORAL_TABLET | ORAL | Status: DC | PRN
  Administered 2013-07-14: 1 via ORAL

## 2013-07-14 MED ORDER — BENZOCAINE-MENTHOL 20-0.5 % EX AERO: 1.0000 "application " | INHALATION_SPRAY | CUTANEOUS | Status: DC | PRN

## 2013-07-14 MED ORDER — FLEET ENEMA 7-19 GM/118ML RE ENEM: 1.0000 | ENEMA | RECTAL | Status: DC | PRN

## 2013-07-14 MED ORDER — ONDANSETRON HCL 4 MG PO TABS: 4.0000 mg | ORAL_TABLET | ORAL | Status: DC | PRN

## 2013-07-14 MED ORDER — CITRIC ACID-SODIUM CITRATE 334-500 MG/5ML PO SOLN: 30.0000 mL | ORAL | Status: DC | PRN

## 2013-07-14 MED ORDER — ONDANSETRON HCL 4 MG/2ML IJ SOLN: 4.0000 mg | INTRAMUSCULAR | Status: DC | PRN

## 2013-07-14 MED ORDER — TETANUS-DIPHTH-ACELL PERTUSSIS 5-2.5-18.5 LF-MCG/0.5 IM SUSP
0.5000 mL | Freq: Once | INTRAMUSCULAR | Status: AC
  Administered 2013-07-15: 0.5 mL via INTRAMUSCULAR
  Filled 2013-07-14: qty 0.5

## 2013-07-14 MED ORDER — FENTANYL CITRATE 0.05 MG/ML IJ SOLN: 100.0000 ug | INTRAMUSCULAR | Status: DC | PRN

## 2013-07-14 MED ORDER — LACTATED RINGERS IV SOLN: 500.0000 mL | INTRAVENOUS | Status: DC | PRN

## 2013-07-14 MED ORDER — INFLUENZA VAC SPLIT QUAD 0.5 ML IM SUSP
0.5000 mL | INTRAMUSCULAR | Status: AC
  Administered 2013-07-15: 0.5 mL via INTRAMUSCULAR

## 2013-07-14 MED ORDER — OXYTOCIN 40 UNITS IN LACTATED RINGERS INFUSION - SIMPLE MED
62.5000 mL/h | INTRAVENOUS | Status: DC
  Filled 2013-07-14: qty 1000

## 2013-07-14 MED ORDER — SIMETHICONE 80 MG PO CHEW
80.0000 mg | CHEWABLE_TABLET | ORAL | Status: DC | PRN
  Administered 2013-07-14: 80 mg via ORAL
  Filled 2013-07-14: qty 1

## 2013-07-14 MED ORDER — OXYTOCIN BOLUS FROM INFUSION
500.0000 mL | INTRAVENOUS | Status: DC
  Administered 2013-07-14: 500 mL via INTRAVENOUS

## 2013-07-14 MED ORDER — PRENATAL MULTIVITAMIN CH
1.0000 | ORAL_TABLET | Freq: Every day | ORAL | Status: DC
  Administered 2013-07-14 – 2013-07-15 (×2): 1 via ORAL
  Filled 2013-07-14 (×2): qty 1

## 2013-07-14 MED ORDER — LACTATED RINGERS IV SOLN
INTRAVENOUS | Status: DC
  Administered 2013-07-14: 03:00:00 via INTRAVENOUS

## 2013-07-14 MED ORDER — LIDOCAINE HCL (PF) 1 % IJ SOLN
30.0000 mL | INTRAMUSCULAR | Status: DC | PRN
  Filled 2013-07-14 (×2): qty 30

## 2013-07-14 NOTE — MAU Note (Signed)
Pt  Here earlier 4cm feeling presssure

## 2013-07-14 NOTE — H&P (Signed)
Lisa Leach is a 22 y.o. female G2P1001 with IUP at [redacted]w[redacted]d presenting for increased pressure and contractions. Pt states she has been having regular, intense contractions, associated with none vaginal bleeding.  Membranes are intact, with active fetal movement.    [facility] since 1st trimester. Genetic screening neg, anatomy u/s normal, 1hr glucola 108, gbs neg. +Ch early pregnancy, treated and had neg POC  Past Medical History: No past medical history on file.  Past Surgical History: No past surgical history on file.  Obstetrical History: OB History   Grav Para Term Preterm Abortions TAB SAB Ect Mult Living   2 1 1       1      1. Term SVD 2009 in [place], 6lb6 2. Current  Gynecological History: Pertinent Gynecological History: Menses: flow is moderate LMP: 10/30/12  Social History: History   Social History  . Marital Status: Single    Spouse Name: N/A    Number of Children: N/A  . Years of Education: N/A   Social History Main Topics  . Smoking status: Former Games developer  . Smokeless tobacco: Never Used  . Alcohol Use: No  . Drug Use: No  . Sexual Activity: Yes    Birth Control/ Protection: None   Other Topics Concern  . Not on file   Social History Narrative  . No narrative on file    Family History: No family history on file.  Allergies: No Known Allergies  Prescriptions prior to admission  Medication Sig Dispense Refill  . acetaminophen (TYLENOL) 500 MG tablet Take 1 tablet (500 mg total) by mouth every 6 (six) hours as needed for pain.  30 tablet  0  . ferrous fumarate (HEMOCYTE - 106 MG FE) 325 (106 FE) MG TABS Take 1 tablet by mouth daily.         Review of Systems   Constitutional: Negative for fever, chills, weight loss, malaise/fatigue and diaphoresis.  HENT: Negative for hearing loss, ear pain, nosebleeds, congestion, sore throat, neck pain, tinnitus and ear discharge.   Eyes: Negative for blurred vision, double vision, photophobia,  pain, discharge and redness.  Respiratory: Negative for cough, hemoptysis, sputum production, shortness of breath, wheezing and stridor.   Cardiovascular: Negative for chest pain, palpitations, orthopnea,  leg swelling  Gastrointestinal: Positive for abdominal pain. Negative for heartburn, nausea, vomiting, diarrhea, constipation, blood in stool Genitourinary: Negative for dysuria, urgency, frequency, hematuria and flank pain.  Musculoskeletal: Negative for myalgias, back pain, joint pain and falls.  Skin: Negative for itching and rash.  Neurological: Negative for dizziness, tingling, tremors, sensory change, speech change, focal weakness, seizures, loss of consciousness, weakness and headaches.  Endo/Heme/Allergies: Negative for environmental allergies and polydipsia. Does not bruise/bleed easily.  Psychiatric/Behavioral: Negative for depression, suicidal ideas, hallucinations, memory loss and substance abuse. The patient is not nervous/anxious and does not have insomnia.       Last menstrual period 10/30/2012. General appearance: alert and cooperative, in apparent discomfort Lungs: clear to auscultation bilaterally Heart: regular rate and rhythm Abdomen: soft, non-tender; bowel sounds normal Pelvic: 9,100, 0 per Vickki Muff RN Extremities: Homans sign is negative, no sign of DVT DTR's nml Presentation: cephalic Fetal monitoringBaseline: 140 bpm, Variability: Good {> 6 bpm), Accelerations: Reactive and Decelerations: Absent Uterine activity regular every 1-2 min Dilation: 9 Effacement (%): 100 Station: 0 Exam by:: Duncan Dull   Prenatal labs: ABO, Rh: A/Positive/-- (05/08 0000) Antibody: Negative (05/08 0000) Rubella:   RPR: Nonreactive (05/08 0000)  HBsAg: Negative (05/08 0000)  HIV:  Non-reactive (05/08 0000)  GBS: Negative (09/18 0000)  1 hr Glucola nml Genetic screening  nml Anatomy US nml Fetal US none Maternal substance abuse none Sig maternal meds; none Maternal labs: GBS  neg   Recent Labs Lab 07/14/13 0250  HGB 12.8    Assessment: Lisa Leach is a 22 y.o. G2P1001 with an IUP at [redacted]w[redacted]d presenting for active labor  Plan: 1. Active labor -routine orders -expect NSVD -HIV NR, GBS neg  2. FHR- tracing reassuring -cat 1  3. Postpartum -plans to breast feed, with bottle feeding supplementation -unsure of contraception  Anselm Lis, MD 07/14/2013, 2:45 AM  I have seen and examined this patient and I agree with the above. Cam Hai 4:19 AM 07/14/2013

## 2013-07-15 NOTE — Discharge Summary (Signed)
Obstetric Discharge Summary Reason for Admission: onset of labor Prenatal Procedures: none Intrapartum Procedures: spontaneous vaginal delivery Postpartum Procedures: none Complications-Operative and Postpartum: none Hemoglobin  Date Value Range Status  07/14/2013 10.4* 12.0 - 15.0 g/dL Final     DELTA CHECK NOTED     REPEATED TO VERIFY     HCT  Date Value Range Status  07/14/2013 29.3* 36.0 - 46.0 % Final    Physical Exam:  General: alert, cooperative, appears stated age and no distress Lochia: appropriate Uterine Fundus: firm RRR no mgt CTAB no wrc DVT Evaluation: No evidence of DVT seen on physical exam. Negative Homan's sign. No cords or calf tenderness. No significant calf/ankle edema.  Discharge Diagnoses: Term Pregnancy-delivered  Discharge Information: Date: 07/15/2013 Activity: pelvic rest Diet: routine Medications: PNV, Ibuprofen and desires mirena for birth control Condition: stable Instructions: refer to practice specific booklet Discharge to: home   Newborn Data: Live born female  Birth Weight: 7 lb 3.7 oz (3280 g) APGAR: 9, 9  Home with mother.  Desires mirena for contraception Br/Bo feeding  Lisa Leach 07/15/2013, 7:52 AM

## 2013-10-17 ENCOUNTER — Ambulatory Visit: Payer: Self-pay | Admitting: Obstetrics & Gynecology

## 2013-10-26 ENCOUNTER — Encounter: Payer: Self-pay | Admitting: Medical

## 2013-10-26 ENCOUNTER — Ambulatory Visit: Payer: Self-pay | Admitting: Medical

## 2013-10-26 ENCOUNTER — Ambulatory Visit (INDEPENDENT_AMBULATORY_CARE_PROVIDER_SITE_OTHER): Payer: Self-pay | Admitting: Medical

## 2013-10-26 NOTE — Patient Instructions (Signed)
Etonogestrel implant Qu es este medicamento? El ETONOGESTREL es un dispositivo anticonceptivo (control de la natalidad). Se utiliza para Neurosurgeonprevenir el embarazo. Se puede utilizar hasta 3 aos. Este medicamento puede ser utilizado para otros usos; si tiene alguna pregunta consulte con su proveedor de atencin mdica o con su farmacutico. MARCAS COMERCIALES DISPONIBLES: Implanon, Nexplanon  Qu le debo informar a mi profesional de la salud antes de tomar este medicamento? Necesita saber si usted presenta alguno de los siguientes problemas o situaciones: -sangrado vaginal anormal -enfermedad vascular o cogulos sanguneos -cncer de mama, cervical, heptico -depresin -diabetes -enfermedad de la vescula biliar -dolores de cabeza -enfermedad cardiaca o ataque cardiaco reciente -alta presin sangunea -alto nivel de colesterol -enfermedad renal -enfermedad heptica -convulsiones -fuma tabaco -una reaccin alrgica o inusual al etonogestrel, otras hormonas, anestsicos o antispticos, medicamentos, alimentos, colorantes o conservantes -si est embarazada o buscando quedar embarazada -si est amamantando a un beb Cmo debo utilizar este medicamento? Este dispositivo se inserta debajo de la piel en la cara interna de la parte superior del brazo por un profesional de Radiographer, therapeuticla salud. Hable con su pediatra para informarse acerca del uso de este medicamento en nios. Puede requerir atencin especial. Sobredosis: Pngase en contacto inmediatamente con un centro toxicolgico o una sala de urgencia si usted cree que haya tomado demasiado medicamento. ATENCIN: Reynolds AmericanEste medicamento es solo para usted. No comparta este medicamento con nadie. Qu sucede si me olvido de una dosis? No se aplica en este caso. Qu puede interactuar con este medicamento? No tome esta medicina con ninguno de los siguientes medicamentos: -amprenavir -bosentano -fosamprenavir  Esta medicina tambin puede interactuar con los  siguientes medicamentos: -medicamentos barbitricos para inducir el sueo o tratar convulsiones -ciertos medicamentos para las infecciones micticas tales como quetoconazol e itraconazol -griseofulvina -medicamentos para tratar convulsiones, tales como carbamazepina, felbamato, oxcarbazepina, fenitona, topiramato -modafinil -fenilbutazona -rifampicina -algunos medicamentos para tratar la infeccin por VIH tales como atazanavir, indinavir, lopinavir, nelfinavir, tipranavir, ritonavir -hierba de [address] [place] Puede ser que esta lista no menciona todas las posibles interacciones. Informe a su profesional de Beazer Homesla salud de [employer] Inctodos los productos a base de hierbas, medicamentos de [place]venta libre o suplementos nutritivos que est tomando. Si usted fuma, consume bebidas alcohlicas o si utiliza drogas ilegales, indqueselo tambin a su profesional de Beazer Homesla salud. Algunas sustancias pueden interactuar con su medicamento. A qu debo estar atento al usar PPL Corporationeste medicamento? Este medicamento no protege contra la infeccin por el VIH (SIDA) o otras enfermedades de transmisin sexual. Usted debe sentir el implante al presionar con las yemas de los dedos sobre la piel donde se insert. Dgale a su mdico si no se siente el implante. Qu efectos secundarios puedo tener al Boston Scientificutilizar este medicamento? Efectos secundarios que debe informar a su mdico o a Producer, television/film/videosu profesional de la salud tan pronto como sea posible: -Therapist, artreacciones alrgicas como erupcin cutnea, picazn o urticarias, hinchazn de la cara, labios o lengua -ndulos mamarios -cambios en la visin -confusin, dificultad para hablar o entender -orina de color oscura -humor deprimido -sensacin general de estar enfermo o sntomas gripales -heces claras -prdida del apetito, nuseas -dolor en la regin abdominal superior derecha -dolores de cabeza severos -Engineer, miningdolor, hinchazon o sensibilidad grave en el abdomen -falta de aliento, dolor en el pecho, hinchazn de la  pierna -seales de Chartered loss adjusterun embarazo -entumecimiento o debilidad repentina de la cara, brazo o pierna -dificultad para caminar, mareos, prdida de equilibrio o coordinacin -sangrado o flujo vaginal inusual -cansancio o debilidad inusual -color amarillento de los ojos o  la piel  Efectos secundarios que, por lo general, no requieren atencin mdica (debe informarlos a su mdico o a Producer, television/film/videosu profesional de la salud si persisten o si son molestos): -acn -dolor de pecho -cambios de peso -tos -fiebre o escalofros -dolor de cabeza -sangrado menstrual irregular -picazn, ardo o flujo vaginal -dolor o dificultad para Geographical information systems officerorinar -dolor de garganta Puede ser que esta lista no menciona todos los posibles efectos secundarios. Comunquese a su mdico por asesoramiento mdico Hewlett-Packardsobre los efectos secundarios. Usted puede informar los efectos secundarios a la FDA por telfono al 1-800-FDA-1088. Dnde debo guardar mi medicina? Este medicamento se administra en hospitales o clnicas y no necesitar guardarlo en su domicilio. ATENCIN: Este folleto es un resumen. Puede ser que no cubra toda la posible informacin. Si usted tiene preguntas acerca de esta medicina, consulte con su mdico, su farmacutico o su profesional de Radiographer, therapeuticla salud.  2014, Elsevier/Gold Standard. (2012-04-27 18:26:08) Levonorgestrel intrauterine device (IUD) Qu es este medicamento? El LEVONORGESTREL (DIU) es un dispositivo anticonceptivo (control de natalidad). El dispositivo se coloca dentro del tero por un profesional de la salud. Se utiliza para Location managerevitar el embarazo y tambin se puede Chemical engineerutilizar para tratar el sangrado abundante que ocurre durante su perodo. Dependiendo del dispositivo, se puede utilizar por 3 a 5 aos. Este medicamento puede ser utilizado para otros usos; si tiene alguna pregunta consulte con su proveedor de atencin mdica o con su farmacutico. MARCAS COMERCIALES DISPONIBLES: Miquel DunnMirena, Skyla Qu le debo informar a mi profesional de  la salud antes de tomar este medicamento? Necesita saber si usted presenta alguno de los siguientes problemas o situaciones: -exmen de Papanicolaou anormal -cncer de mama, cuello del tero o tero -diabetes -endometritis -si tiene una infeccin plvica o genital actual o en el pasado -tiene ms de una pareja sexual o si su pareja tiene ms de una pareja -enfermedad cardiaca -antecedente de embarazo tubrico o ectpico -problemas del sistema inmunolgico -DIU colocado -enfermedad heptica o tumor del hgado -problemas con la coagulacin o si toma diluyentes sanguneos -Bots[place]ausa medicamentos intravenoso -forma inusual del tero -sangrado vaginal que no tiene explicacin -una reaccin alrgica o inusual al levonorgestrel, a otras hormonas, a la silicona o polietilenos, a otros medicamentos, alimentos, colorantes o conservantes -si est embarazada o buscando quedar embarazada -si est amamantando a un beb Cmo debo utilizar este medicamento? Un profesional de Naval architectla salud coloca este dispositivo en el tero. Hable con su pediatra para informarse acerca del uso de este medicamento en nios. Puede requerir atencin especial. Sobredosis: Pngase en contacto inmediatamente con un centro toxicolgico o una sala de urgencia si usted cree que haya tomado demasiado medicamento. ATENCIN: Reynolds AmericanEste medicamento es solo para usted. No comparta este medicamento con nadie. Qu sucede si me olvido de una dosis? No se aplica en este caso. Qu puede interactuar con este medicamento? No tome esta medicina con ninguno de los siguientes medicamentos: -amprenavir -bosentano -fosamprenavir Esta medicina tambin puede interactuar con los siguientes medicamentos: -aprepitant -barbitricos para producir el sueo o para el tratamiento de convulsiones -bexaroteno -griseofulvina -medicamentos para tratar los convulsiones, tales como Standing Pinecarbamazepina, Bolingbrokeetotona, Parlierfelbamato, Bellmontoxcarbazepina, Artasfenitona,  topiramato -modafinilo -pioglitazona -rifabutina -rifampicina -rifapentina -algunos medicamentos para tratar el virus VIH, tales como atazanavir, indinavir, lopinavir, nelfinavir, tipranavir, ritonavir -hierba de [place] -warfarina Puede ser que esta lista no menciona todas las posibles interacciones. Informe a su profesional de Beazer Homesla salud de In[employer] Micro Inctodos los productos a base de hierbas, medicamentos de [place] Chazyventa libre o suplementos nutritivos que est tomando. Si usted fuma, consume bebidas  alcohlicas o si utiliza drogas ilegales, indqueselo tambin a su profesional de Beazer Homes. Algunas sustancias pueden interactuar con su medicamento. A qu debo estar atento al usar PPL Corporation? Visite a su mdico o a su profesional de la salud para chequear su evolucin peridicamente. Visite a su mdico si usted o su pareja tiene relaciones sexuales con [employer], se vuelve VIH positivo o contrae una enfermedad de transmisin sexual. Este medicamento no la protege de la infeccin por VIH (SIDA) ni de ninguna otra enfermedad de transmisin sexual. Puede controlar la ubicacin del DIU usted misma palpando con sus dedos limpios los hilos en la parte anterior de la vagina. No tire de los hilos. Es un buen hbito controlar la ubicacin del dispositivo despus de cada perodo menstrual. Si no slo siente los hilos sino que adems siente otra parte ms del DIU o si no puede sentir los hilos, consulte a su mdico inmediatamente. El DIU puede salirse por s solo. Puede quedar embarazada si el dispositivo se sale de Nature conservation officer. Utilice un mtodo anticonceptivo adicional, como preservativos, y consulte a su proveedor de atencin mdica s observa que el DIU se sali de Nature conservation officer. La utilizacin de tampones no cambia la posicin del DIU y no hay inconvenientes en usarlos durante su perodo. Qu efectos secundarios puedo tener al [employer] este medicamento? Efectos secundarios que debe informar a su mdico o a Producer, television/film/video  de la salud tan pronto como sea posible: -Therapist, art como erupcin cutnea, picazn o urticarias, hinchazn de la cara, labios o lengua -fiebre, sntomas gripales -llagas genitales -alta presin sangunea -ausencia de un perodo menstrual durante 6 semanas mientras lo utiliza -Engineer, mining, Public librarian en las piernas -dolor o sensibilidad del plvico -dolor de cabeza repentino o severo -signos de Psychiatrist -calambres estomacales -falta de aliento repentina -problemas de coordinacin, del habla, al caminar -sangrado, flujo vaginal inusual -color amarillento de los ojos o la piel Efectos secundarios que, por lo general, no requieren atencin mdica (debe informarlos a su mdico o a su profesional de la salud si persisten o si son molestos): -acn -dolor de pecho -cambios en el deseo sexual o capacidad -cambios de peso -calambres, Research scientist (life sciences) o sensacin de [employer] se introduce el dispositivo -dolor de cabeza -sangrado menstruales irregulares en los primeros 3 a 6 meses de usar -nuseas Puede ser que esta lista no menciona todos los posibles efectos secundarios. Comunquese a su mdico por asesoramiento mdico Hewlett-Packard. Usted puede informar los efectos secundarios a la FDA por telfono al 1-800-FDA-1088. Dnde debo guardar mi medicina? No se aplica en este caso. ATENCIN: Este folleto es un resumen. Puede ser que no cubra toda la posible informacin. Si usted tiene preguntas acerca de esta medicina, consulte con su mdico, su farmacutico o su profesional de Radiographer, therapeutic.  2014, Elsevier/Gold Standard. (2011-11-09 16:57:41)

## 2013-10-26 NOTE — Progress Notes (Signed)
Patient desires nexplanon and [facility]. She has not been sexually active. Her only complaint is headaches occasionally.

## 2013-10-26 NOTE — Progress Notes (Signed)
Patient ID: Lisa Leach, female   DOB: 11/01/1990, 23 y.o.   MRN: [id number] Subjective:     Lisa Leach is a 23 y.o. female who presents for a postpartum visit. She is 14 weeks postpartum following a spontaneous vaginal delivery. I have fully reviewed the prenatal and intrapartum course. The delivery was at 40.1 gestational weeks. Outcome: spontaneous vaginal delivery. Anesthesia: none. Postpartum course has been normal. Baby's course has been normal. Baby is feeding by breast. Patient has just started to wean to bottle feeding for her return to work. Using Masco Corporationerber formula. Bleeding no bleeding. Bowel function is normal. Bladder function is normal. Patient is not sexually active. Contraception method is none. Postpartum depression screening: negative.  The following portions of the patient's history were reviewed and updated as appropriate: allergies, current medications, past family history, past medical history, past social history, past surgical history and problem list.  Review of Systems Pertinent items are noted in HPI.   Objective:    BP 105/68  Pulse 73  Temp(Src) 98 F (36.7 C)  Ht 5' (1.524 m)  Wt 134 lb (60.782 kg)  BMI 26.17 kg/m2  Breastfeeding? Yes  General:  alert and cooperative   Breasts:  Not performed  Lungs: clear to auscultation bilaterally  Heart:  regular rate and rhythm, S1, S2 normal, no murmur, click, rub or gallop  Abdomen: soft, nontender, no masses   Vulva:  not evaluated  Vagina: not evaluated  Cervix:  not evaluated  Corpus: not examined  Adnexa:  not evaluated  Rectal Exam: Not performed.         MDM All birth control options were discussed including the risks and benefits of each and cost of each Assessment:     Normal postpartum exam. Pap smear not done at today's visit.  Birth control counseling Plan:    1. Contraception: patient would like to choose between the Nexplanon and Mirena. She plans to contact [facility] for pricing  before she decides 2. Patient given information about both Nexplanon and Mirena while in clinic today. Patient also given contact # for [facility] 3. Follow up as needed. [facility] her [facility] at a more affordable price. [facility] at any time

## 2013-10-29 ENCOUNTER — Encounter: Payer: Self-pay | Admitting: Medical

## 2013-10-29 NOTE — Progress Notes (Signed)
Patient ID: [name], female   DOB: 07/28/1991, 23 y.o.   MRN: [id number]  Appointment cancelled. Opened in error

## 2014-08-05 ENCOUNTER — Encounter: Payer: Self-pay | Admitting: Medical

## 2014-10-04 NOTE — L&D Delivery Note (Signed)
Delivery Note At 1:33 PM a viable and healthy female was delivered via  (Presentation: ROA ).  APGAR: , ; weight  .   Placenta status: spontaneous and grossly intact with 3V Cord:  with the following complications: none  Anesthesia:  none Episiotomy:  none Lacerations:  Bilateral periurethral abrasions Suture Repair: none Est. Blood Loss (mL):  200  Mom to postpartum.  Baby to Couplet care / Skin to Skin.  [facility],Lisa Rapozo 09/19/2015, 1:47 PM

## 2014-11-11 IMAGING — US US OB FOLLOW-UP
1 series · 12 of 28 positions shown · non-contrast
Comparison: none

[Series 1: us ob follow up · 34 acquisitions, 12 frames shown]
[im 2/34]
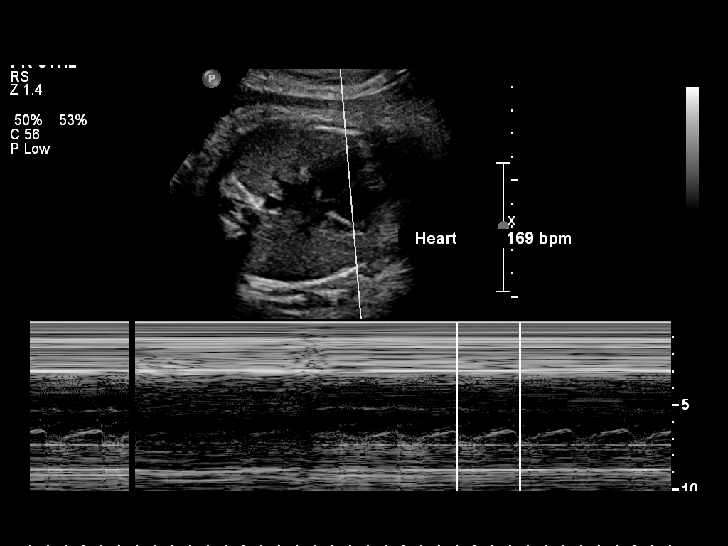
[im 4/34]
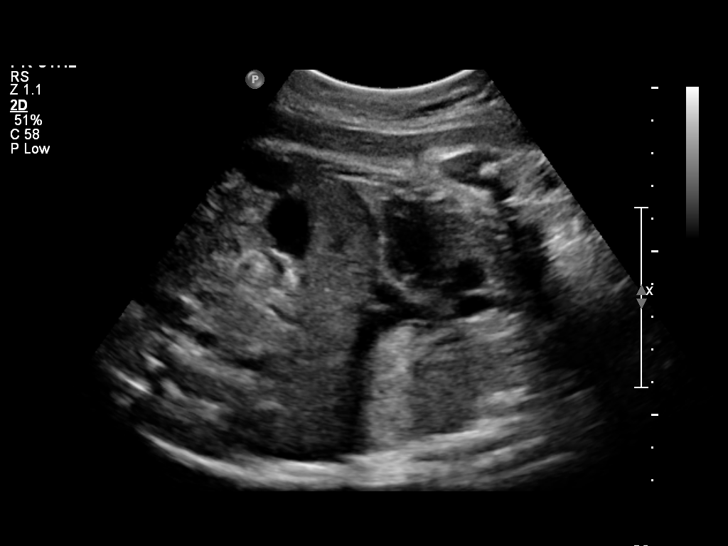
[im 7/34]
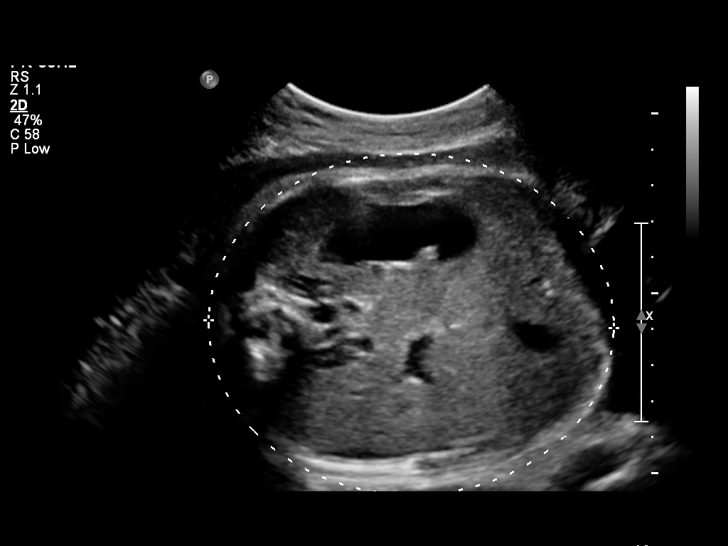
[im 10/34]
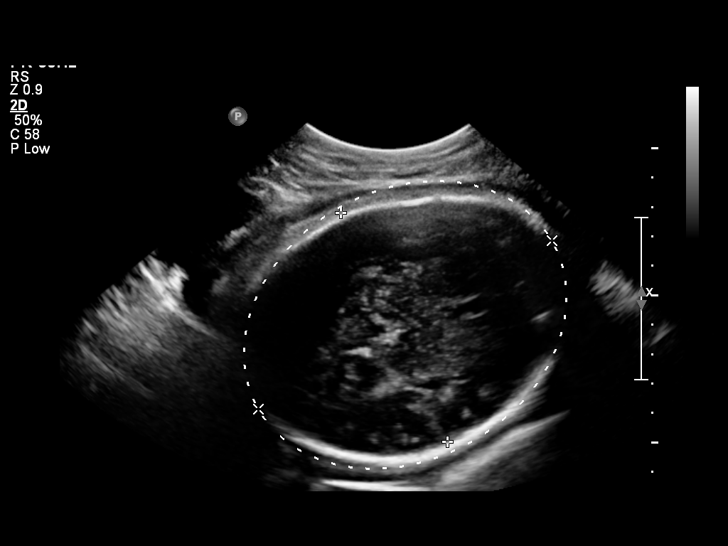
[im 13/34]
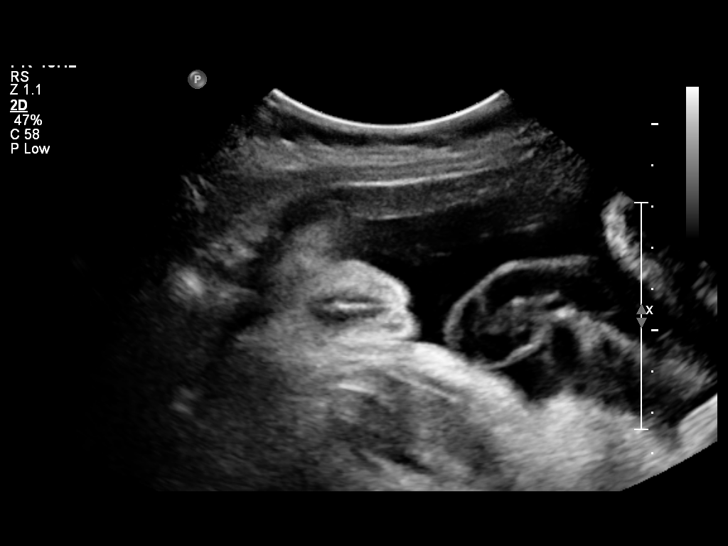
[im 15/34]
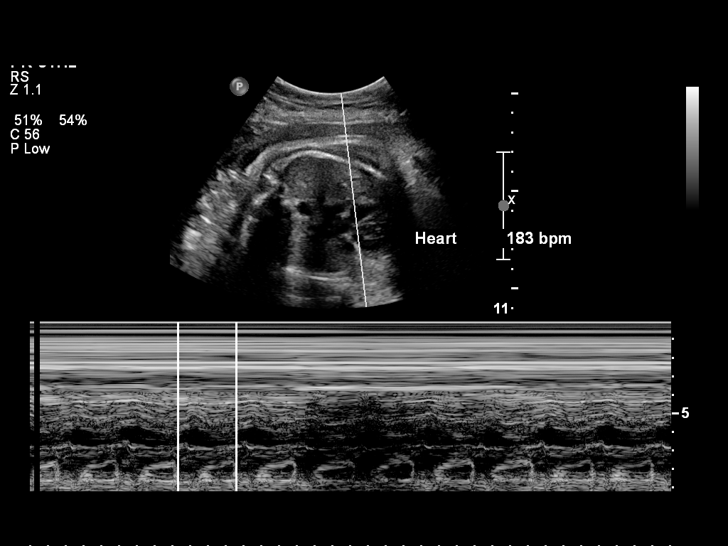
[im 19/34]
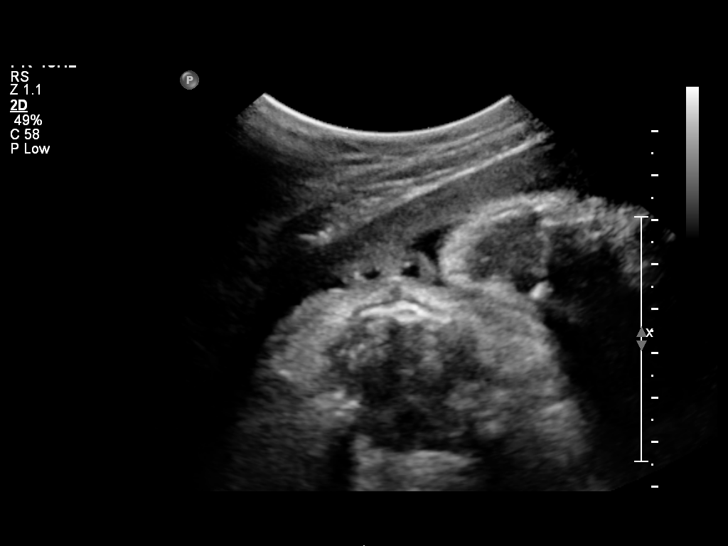
[im 21/34]
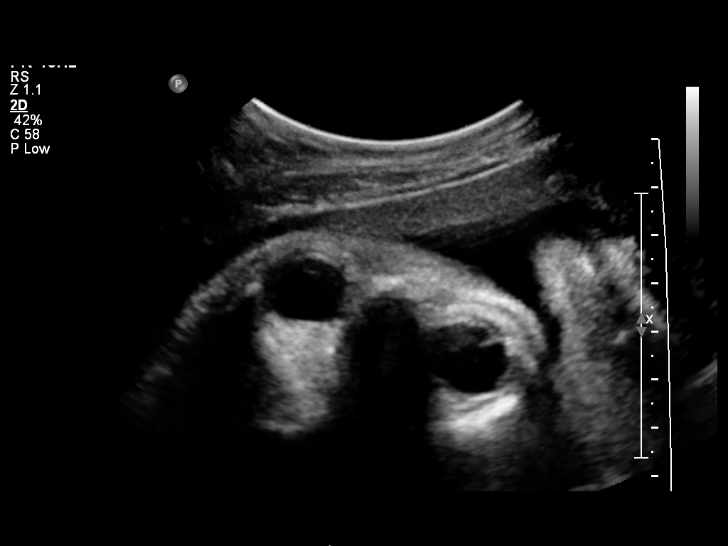
[im 24/34]
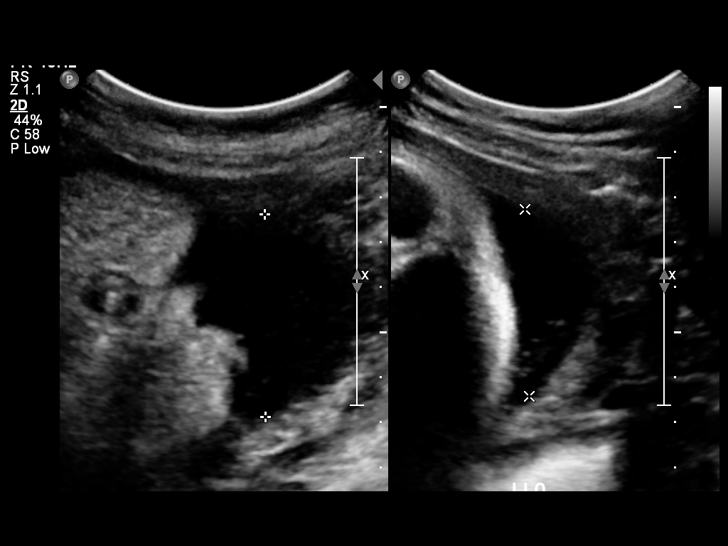
[im 27/34]
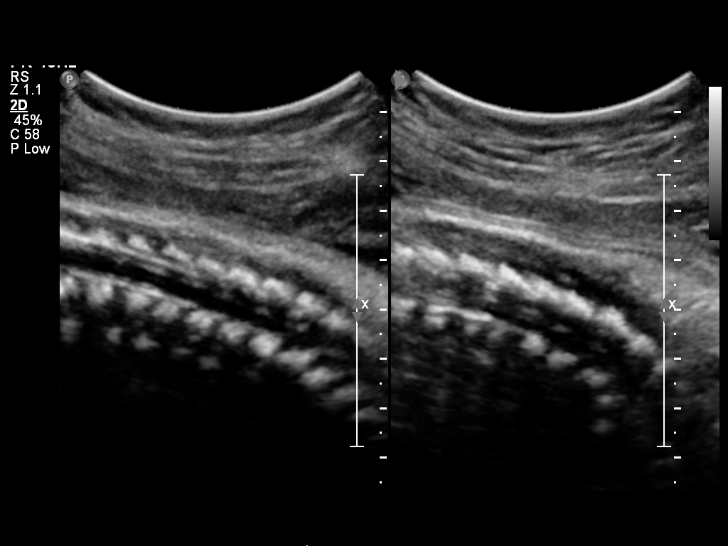
[im 30/34]
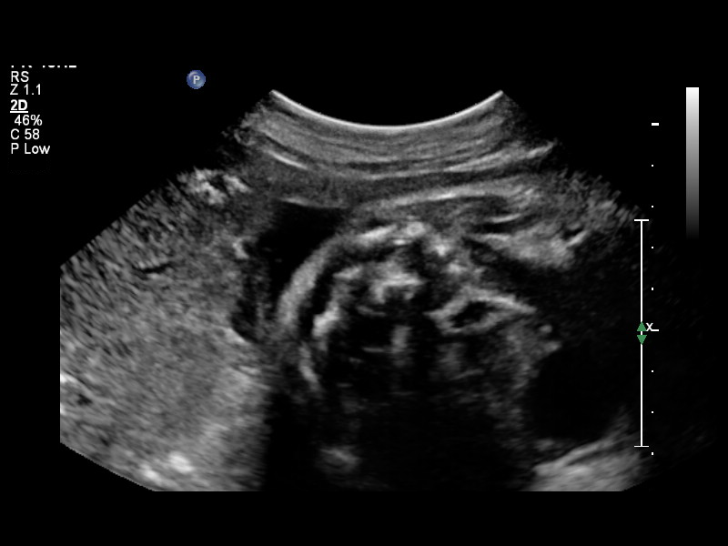
[im 32/34]
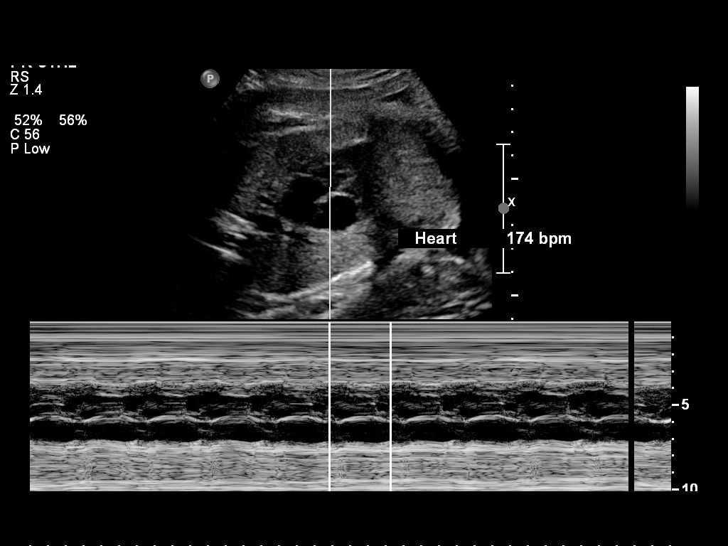

[12 of 28 positions shown; findings below may reference images not displayed]

OBSTETRICS REPORT
                      (Signed Final 06/18/2013 [DATE])

Service(s) Provided

 US OB FOLLOW UP                                       76816.1
Indications

 Basic anatomic survey
 Size less than dates (Small for gestational [AGE]
 FGR)
Fetal Evaluation

 Num Of Fetuses:    1
 Fetal Heart Rate:  169                          bpm
 Cardiac Activity:  Tachycardia (169-184)
 Presentation:      Cephalic
 Placenta:          Right lateral, above
                    cervical os

 Amniotic Fluid
 AFI FV:      Subjectively within normal limits
 AFI Sum:     15.35   cm       57  %Tile     Larg Pckt:    4.47  cm
 RUQ:   2.64    cm   RLQ:    4.11   cm    LUQ:   4.47    cm   LLQ:    4.13   cm
Biometry

 BPD:     85.7  mm     G. Age:  34w 4d                CI:         76.4   70 - 86
 OFD:    112.1  mm                                    FL/HC:      20.4   20.1 -

 HC:     323.2  mm     G. Age:  36w 4d       24  %    HC/AC:      0.99   0.93 -

 AC:     328.1  mm     G. Age:  36w 5d       69  %    FL/BPD:     76.9   71 - 87
 FL:      65.9  mm     G. Age:  34w 0d        4  %    FL/AC:      20.1   20 - 24

 Est. FW:    3313  gm      6 lb 1 oz     51  %
Gestational Age

 Clinical EDD:  36w 3d                                        EDD:   07/13/13
 U/S Today:     35w 3d                                        EDD:   07/20/13
 Best:          36w 3d     Det. By:  Clinical EDD             EDD:   07/13/13
Anatomy

 Cranium:          Appears normal         Ductal Arch:      Basic anatomy
                                                            exam per order
 Fetal Cavum:      Appears normal         Diaphragm:        Appears normal
 Ventricles:       Appears normal         Stomach:          Appears normal
 Choroid Plexus:   Appears normal         Abdomen:          Not well visualized
 Cerebellum:       Not well visualized    Abdominal Wall:   Not well visualized
 Posterior Fossa:  Not well visualized    Cord Vessels:     Appears normal (3
                                                            vessel cord)
 Nuchal Fold:      Not applicable (>20    Kidneys:          Appear normal
                   wks GA)
 Lips:             Appears normal         Bladder:          Appears normal
 Heart:            Appears normal         Spine:            Appears normal
                   (4CH, axis, and
                   situs)
 RVOT:             Not well visualized    Lower             Not well visualized
                                          Extremities:
 LVOT:             Appears normal         Upper             Not well visualized
                                          Extremities:
 Aortic Arch:      Basic anatomy
                   exam per order

 Other:  Female gender.
Cervix Uterus Adnexa

 Cervix:       Not visualized (advanced GA >84wks)
Impression

 Active SIUP at 44w4d on attempted fetal survey given uterine
 size measuring less than dates
 Cephalic presentation
 Symmetric fetal growth pattern
 EFW is at the 51st percentile
 Limitations to fetal survey as documented above
 No previa
 AFI is gestational age appropriate
 Given noted fetal heart rate, the patient was sent from
 [HOSPITAL] to [facility] for NST.  Today's NST demonstrates
 FHR in the 534's with accelerations to the 279's, which is
 reassuring for fetal well-being and indicates a reactive FHR.

 Today's findings and their implications were discussed with
 the patient as reassuring for fetal well-being and normal
 interval growth.  All questions were answered to acheive
 adequate understanding by way of Spanish interpreter.
Recommendations

 Follow up ultrasound in 3-4 weeks to complete anatomic
 survey and plot interval growth if patient remains undelivered.

 questions or concerns.

## 2015-02-04 ENCOUNTER — Inpatient Hospital Stay (HOSPITAL_COMMUNITY): Payer: Self-pay

## 2015-02-04 ENCOUNTER — Encounter (HOSPITAL_COMMUNITY): Payer: Self-pay | Admitting: *Deleted

## 2015-02-04 ENCOUNTER — Inpatient Hospital Stay (HOSPITAL_COMMUNITY)
Admission: AD | Admit: 2015-02-04 | Discharge: 2015-02-04 | Disposition: A | Discharge status: Home / Self Care | Payer: Self-pay | Source: Ambulatory Visit | Attending: Family Medicine | Admitting: Family Medicine

## 2015-02-04 DIAGNOSIS — R109 Unspecified abdominal pain: Secondary | ICD-10-CM | POA: Insufficient documentation

## 2015-02-04 DIAGNOSIS — G4489 Other headache syndrome: Secondary | ICD-10-CM

## 2015-02-04 DIAGNOSIS — O26899 Other specified pregnancy related conditions, unspecified trimester: Secondary | ICD-10-CM

## 2015-02-04 DIAGNOSIS — R51 Headache: Secondary | ICD-10-CM | POA: Insufficient documentation

## 2015-02-04 DIAGNOSIS — Z87891 Personal history of nicotine dependence: Secondary | ICD-10-CM | POA: Insufficient documentation

## 2015-02-04 DIAGNOSIS — O26841 Uterine size-date discrepancy, first trimester: Secondary | ICD-10-CM | POA: Insufficient documentation

## 2015-02-04 DIAGNOSIS — Z3A10 10 weeks gestation of pregnancy: Secondary | ICD-10-CM | POA: Insufficient documentation

## 2015-02-04 LAB — URINALYSIS, ROUTINE W REFLEX MICROSCOPIC
BILIRUBIN URINE: NEGATIVE
Glucose, UA: NEGATIVE mg/dL
Hgb urine dipstick: NEGATIVE
KETONES UR: NEGATIVE mg/dL
NITRITE: NEGATIVE
PH: 7 (ref 5.0–8.0)
Protein, ur: NEGATIVE mg/dL
Specific Gravity, Urine: 1.01 (ref 1.005–1.030)
Urobilinogen, UA: 1 mg/dL (ref 0.0–1.0)

## 2015-02-04 LAB — CBC
HCT: 27.1 % — ABNORMAL LOW (ref 36.0–46.0)
Hemoglobin: 9 g/dL — ABNORMAL LOW (ref 12.0–15.0)
MCH: 25.1 pg — ABNORMAL LOW (ref 26.0–34.0)
MCHC: 33.2 g/dL (ref 30.0–36.0)
MCV: 75.5 fL — ABNORMAL LOW (ref 78.0–100.0)
PLATELETS: 255 10*3/uL (ref 150–400)
RBC: 3.59 MIL/uL — ABNORMAL LOW (ref 3.87–5.11)
RDW: 18.8 % — ABNORMAL HIGH (ref 11.5–15.5)
WBC: 10 10*3/uL (ref 4.0–10.5)

## 2015-02-04 LAB — HCG, QUANTITATIVE, PREGNANCY: HCG, BETA CHAIN, QUANT, S: 138129 m[IU]/mL — AB (ref <5)

## 2015-02-04 LAB — WET PREP, GENITAL
Trich, Wet Prep: NONE SEEN
YEAST WET PREP: NONE SEEN

## 2015-02-04 LAB — URINE MICROSCOPIC-ADD ON

## 2015-02-04 LAB — POCT PREGNANCY, URINE: Preg Test, Ur: POSITIVE — AB

## 2015-02-04 MED ORDER — DIPHENHYDRAMINE HCL 50 MG/ML IJ SOLN
25.0000 mg | Freq: Once | INTRAMUSCULAR | Status: AC
  Administered 2015-02-04: 25 mg via INTRAVENOUS
  Filled 2015-02-04: qty 1

## 2015-02-04 MED ORDER — LACTATED RINGERS IV BOLUS (SEPSIS)
1000.0000 mL | Freq: Once | INTRAVENOUS | Status: AC
  Administered 2015-02-04: 1000 mL via INTRAVENOUS

## 2015-02-04 MED ORDER — ACETAMINOPHEN 500 MG PO TABS: 1000.0000 mg | ORAL_TABLET | Freq: Four times a day (QID) | ORAL | Status: DC | PRN

## 2015-02-04 MED ORDER — OXYCODONE-ACETAMINOPHEN 5-325 MG PO TABS
2.0000 | ORAL_TABLET | ORAL | Status: AC
  Administered 2015-02-04: 2 via ORAL
  Filled 2015-02-04: qty 2

## 2015-02-04 MED ORDER — PROCHLORPERAZINE EDISYLATE 5 MG/ML IJ SOLN
10.0000 mg | Freq: Once | INTRAMUSCULAR | Status: AC
  Administered 2015-02-04: 10 mg via INTRAVENOUS
  Filled 2015-02-04: qty 2

## 2015-02-04 MED ORDER — TRAMADOL HCL 50 MG PO TABS: 50.0000 mg | ORAL_TABLET | Freq: Four times a day (QID) | ORAL | Status: DC | PRN

## 2015-02-04 MED ORDER — DEXAMETHASONE SODIUM PHOSPHATE 10 MG/ML IJ SOLN
10.0000 mg | Freq: Once | INTRAMUSCULAR | Status: AC
  Administered 2015-02-04: 10 mg via INTRAVENOUS
  Filled 2015-02-04: qty 1

## 2015-02-04 NOTE — MAU Provider Note (Signed)
Chief Complaint: No chief complaint on file.  First Provider Initiated Contact with Patient 02/04/15 1941      SUBJECTIVE HPI: Lisa Leach is a 24 y.o. G3P2002 at 4977w1d by LMP who presents to [facility] reporting low abdominal pain since yesterday, generalized headache today and nausea with rare vomiting times one month. Positive home pregnancy test. Hasn't had any other testing this pregnancy. Reports subjective fever (feeling hot). Denies chills, vaginal bleeding, diarrhea.   Results abdominal pain 7/10 on pain scale. Describes it as crampy, intermittent. Partial relief with Tylenol. There are no aggravating or alleviating factors. Rates headache 10/10 on pain scale. Similar to frequent headaches that she has had in other pregnancies. Describes it as throbbing. Mild relief with Tylenol. Last dose of Tylenol yesterday. No antipyretics today.  Bedside interpreter used.  History reviewed. No pertinent past medical history. OB History  Gravida Para Term Preterm AB SAB TAB Ectopic Multiple Living  3 2 2       2     # Outcome Date GA Lbr Len/2nd Weight Sex Delivery Anes PTL Lv  3 Current           2 Term 07/14/13 6631w1d 04:00 / 00:14 7 lb 3.7 oz (3.28 kg) F Vag-Spont None  Y  1 Term     M Vag-Spont   Y     History reviewed. No pertinent past surgical history. History   Social History  . Marital Status: Single    Spouse Name: N/A  . Number of Children: N/A  . Years of Education: N/A   Occupational History  . Not on file.   Social History Main Topics  . Smoking status: Former Games developermoker  . Smokeless tobacco: Never Used  . Alcohol Use: No  . Drug Use: No  . Sexual Activity: Yes    Birth Control/ Protection: None   Other Topics Concern  . Not on file   Social History Narrative   No current facility-administered medications on file prior to encounter.   No current outpatient prescriptions on file prior to encounter.   No Known Allergies  Review of Systems   Constitutional: Positive for fever (subjective--did not check w/ thermometer). Negative for chills.  HENT: Negative for congestion, ear pain and sore throat.   Eyes: Negative for blurred vision and photophobia.  Gastrointestinal: Positive for nausea, vomiting and abdominal pain. Negative for diarrhea and constipation.  Genitourinary: Positive for frequency. Negative for dysuria, urgency, hematuria and flank pain.       Negative for vaginal bleeding, vaginal discharge or vaginal odor  Musculoskeletal: Positive for back pain. Negative for myalgias.  Neurological: Positive for headaches. Negative for dizziness, speech change, focal weakness, loss of consciousness and weakness.    OBJECTIVE Blood pressure 97/54, pulse 74, temperature 98.9 F (37.2 C), temperature source Oral, resp. rate 18, weight 120 lb (54.432 kg), last menstrual period 11/25/2014, not currently breastfeeding. GENERAL: Well-developed, well-nourished female in mild distress.  HEENT: No congestion. Symmetric facial movements. Sinuses tender. HEART: normal rate RESP: normal effort GI: Abdomen soft, mild suprapubic tenderness and slightly to right of suprapubic area. Questionable rebound tenderness. No mass. Positive bowel sounds 4. MS: Nontender, no edema NEURO: Alert and oriented SPECULUM EXAM: NEFG, mild/moderate amount of mildly malodorous, yellow, frothy discharge, no blood noted, cervix clean BIMANUAL: cervix closed; difficult to determine uterine size due to patient intolerance of exam. Not consistent with 10 week pregnancy. No adnexal tenderness or masses. Patient generally uncomfortable with exam, but no specific cervical motion  tenderness.  LAB RESULTS Results for orders placed or performed during the hospital encounter of 02/04/15 (from the past 24 hour(s))  Urinalysis, Routine w reflex microscopic     Status: Abnormal   Collection Time: 02/04/15  7:14 PM  Result Value Ref Range   Color, Urine YELLOW YELLOW    APPearance CLEAR CLEAR   Specific Gravity, Urine 1.010 1.005 - 1.030   pH 7.0 5.0 - 8.0   Glucose, UA NEGATIVE NEGATIVE mg/dL   Hgb urine dipstick NEGATIVE NEGATIVE   Bilirubin Urine NEGATIVE NEGATIVE   Ketones, ur NEGATIVE NEGATIVE mg/dL   Protein, ur NEGATIVE NEGATIVE mg/dL   Urobilinogen, UA 1.0 0.0 - 1.0 mg/dL   Nitrite NEGATIVE NEGATIVE   Leukocytes, UA SMALL (A) NEGATIVE  Urine microscopic-add on     Status: Abnormal   Collection Time: 02/04/15  7:14 PM  Result Value Ref Range   Squamous Epithelial / LPF RARE RARE   WBC, UA 3-6 <3 WBC/hpf   RBC / HPF 0-2 <3 RBC/hpf   Bacteria, UA FEW (A) RARE  Pregnancy, urine POC     Status: Abnormal   Collection Time: 02/04/15  7:18 PM  Result Value Ref Range   Preg Test, Ur POSITIVE (A) NEGATIVE  Wet prep, genital     Status: Abnormal   Collection Time: 02/04/15  7:45 PM  Result Value Ref Range   Yeast Wet Prep HPF POC NONE SEEN NONE SEEN   Trich, Wet Prep NONE SEEN NONE SEEN   Clue Cells Wet Prep HPF POC FEW (A) NONE SEEN   WBC, Wet Prep HPF POC MANY (A) NONE SEEN  CBC     Status: Abnormal   Collection Time: 02/04/15  8:13 PM  Result Value Ref Range   WBC 10.0 4.0 - 10.5 K/uL   RBC 3.59 (L) 3.87 - 5.11 MIL/uL   Hemoglobin 9.0 (L) 12.0 - 15.0 g/dL   HCT 16.1 (L) 09.6 - 04.5 %   MCV 75.5 (L) 78.0 - 100.0 fL   MCH 25.1 (L) 26.0 - 34.0 pg   MCHC 33.2 30.0 - 36.0 g/dL   RDW 40.9 (H) 81.1 - 91.4 %   Platelets 255 150 - 400 K/uL  hCG, quantitative, pregnancy     Status: Abnormal   Collection Time: 02/04/15  8:13 PM  Result Value Ref Range   hCG, Beta Chain, Quant, S 138129 (H) <5 mIU/mL    IMAGING US Ob Comp Less 14 Wks  02/04/2015   CLINICAL DATA:  Abdominal pain, pregnant  EXAM: OBSTETRIC <14 WK [place] AND TRANSVAGINAL OB US  TECHNIQUE: Both transabdominal and transvaginal ultrasound examinations were performed for complete evaluation of the gestation as well as the maternal uterus, adnexal regions, and pelvic cul-de-sac. Transvaginal  technique was performed to assess early pregnancy.  COMPARISON:  None.  FINDINGS: Intrauterine gestational sac: Visualized/normal in shape.  Yolk sac:  Present  Embryo:  Present  Cardiac Activity: Present  Heart Rate: 180  bpm  CRL:  19.4  mm   8 w   4 d                  [place] EDC: 09/12/2015  Maternal uterus/adnexae: Trace pelvic free fluid likely physiologic. Right ovarian corpus luteum cyst. Ovaries otherwise normal. Small subchorionic hemorrhage along the right side measuring 1.4 x 0.8 x 0.4 cm.  IMPRESSION: 1. Single live intrauterine pregnancy dating 8 weeks 4 days. 2. Small subchorionic hemorrhage. Attention on follow-up examination is recommended.   Electronically Signed  By: Elige Ko   On: 02/04/2015 20:50   US Ob Transvaginal  02/04/2015   CLINICAL DATA:  Abdominal pain, pregnant  EXAM: OBSTETRIC <14 WK [place] AND TRANSVAGINAL OB US  TECHNIQUE: Both transabdominal and transvaginal ultrasound examinations were performed for complete evaluation of the gestation as well as the maternal uterus, adnexal regions, and pelvic cul-de-sac. Transvaginal technique was performed to assess early pregnancy.  COMPARISON:  None.  FINDINGS: Intrauterine gestational sac: Visualized/normal in shape.  Yolk sac:  Present  Embryo:  Present  Cardiac Activity: Present  Heart Rate: 180  bpm  CRL:  19.4  mm   8 w   4 d                  [place] EDC: 09/12/2015  Maternal uterus/adnexae: Trace pelvic free fluid likely physiologic. Right ovarian corpus luteum cyst. Ovaries otherwise normal. Small subchorionic hemorrhage along the right side measuring 1.4 x 0.8 x 0.4 cm.  IMPRESSION: 1. Single live intrauterine pregnancy dating 8 weeks 4 days. 2. Small subchorionic hemorrhage. Attention on follow-up examination is recommended.   Electronically Signed   By: Elige Ko   On: 02/04/2015 20:50   MAU COURSE CBC, Quant, ultrasound, wet prep, GC/chlamydia, UA, Percocet.  Abdominal pain resolved. Live single intrauterine pregnancy, size less  than dates. Headache only slightly improved. Now 9/10 on pain scale. Migraine cocktail ordered.  Care of patient turned over to Vonzella Nipple, PA at 9:45 PM. Patient reports significant improvement in headache after medication and IV fluids  ASSESSMENT 1. Abdominal pain affecting pregnancy, antepartum   2. Headache associated with hormonal factors   3. Significant discrepancy between uterine size and clinical dates, antepartum, first trimester    PLAN Discharge home First trimester precautions discussed Patient advised to follow-up with OB provider of choice to start prenatal care Patient may return to MAU as needed or if her condition were to change or worsen   Marny Lowenstein, PA-C 02/04/2015  10:40 PM

## 2015-02-04 NOTE — MAU Note (Addendum)
Pt feels "contraction pain in her back and abdomen" since yesterday. Pt also has felt week since Sunday. Pt has a headache since Sunday. Pt has been taking Tylenol at home, but it only helps "for a little bit. Pt also states that she had chills since yesterday.

## 2015-02-04 NOTE — Discharge Instructions (Signed)
Dolor abdominal en el embarazo (Abdominal Pain During Pregnancy) El dolor abdominal es frecuente durante el embarazo. Generalmente no causa ningn dao. El dolor abdominal puede tener numerosas causas. Algunas causas son ms graves que otras. Ciertas causas de dolor abdominal durante el embarazo se diagnostican fcilmente. A veces, se tarda un tiempo para llegar al diagnstico. Otras veces la causa no se conoce. El dolor abdominal puede estar relacionado con [place]alguna alteracin del Kulpmontembarazo, o puede deberse a una causa totalmente diferente. Por este motivo, siempre consulte a su mdico cuando sienta molestias abdominales. INSTRUCCIONES PARA EL CUIDADO EN EL HOGAR  Est atenta al dolor para ver si hay cambios. Las siguientes indicaciones ayudarn a Psychologist, educationalaliviar cualquier Longs Drug Storesmolestia que pueda sentir:  No Chiropodisttenga relaciones sexuales y no coloque nada dentro de la vagina hasta que los sntomas hayan desaparecido completamente.  Descanse todo lo que pueda RadioShackhasta que el dolor se le haya calmado.  Si siente nuseas, beba lquidos claros. Evite los alimentos slidos mientras sienta malestar o tenga nuseas.  Tome slo medicamentos de venta libre o recetados, segn las indicaciones del mdico.  Cumpla con todas las visitas de control, segn le indique su mdico. SOLICITE ATENCIN MDICA DE INMEDIATO SI:  Tiene un sangrado, prdida de lquidos o elimina tejidos por la vagina.  El dolor o los clicos Big Fallsaumentan.  Tiene vmitos persistentes.  Comienza a Financial risk analystsentir dolor al orinar u Centex Corporationobserva sangre.  Tiene fiebre.  Nota que los movimientos del beb disminuyen.  Siente intensa debilidad o se marea.  Tiene dificultad para respirar con o sin dolor abdominal.  Siente un dolor de cabeza intenso junto al dolor abdominal.  Shelle Ironiene una secrecin vaginal anormal con dolor abdominal.  Tiene diarrea persistente.  El dolor abdominal sigue o empeora an despus de Field seismologisthacer reposo. ASEGRESE DE QUE:   Comprende estas  instrucciones.  Controlar su afeccin.  Recibir ayuda de inmediato si no mejora o si empeora. Document Released: 09/20/2005 Document Revised: 07/11/2013 [facility] Patient Information 2015 [place], [place]. This information is not intended to replace advice given to you by your health care provider. Make sure you discuss any questions you have with your health care provider.  Cefalea migraosa recurrente (Recurrent Migraine Headache) Una cefalea migraosa es un dolor intenso y punzante en uno o ambos lados de la cabeza. Las migraas recurrentes aparecen Neomia Dearuna y Laverda Pageotra vez. La migraa puede durar desde 30 minutos hasta varias horas. CAUSAS  No siempre se conoce la causa exacta de la cefalea migraosa. Sin embargo, IT consultantpueden aparecer Circuit Citycuando los nervios del cerebro se irritan y liberan ciertas sustancias qumicas que causan inflamacin. Esto ocasiona dolor. Existen tambin ciertos factores que pueden desencadenar las migraas, como los siguientes:   Alcohol.  Fumar.  Estrs.  La menstruacin  Quesos aejados.  Los alimentos o las bebidas que contienen nitratos, glutamato, aspartamo o tiramina.  Falta de sueo.  Chocolate.  Cafena.  Hambre.  Actividad fsica extenuante.  Fatiga.  Medicamentos que se usan para tratar Aeronautical engineerel dolor en el pecho (nitroglicerina), pldoras anticonceptivas, estrgeno y algunos medicamentos para la hipertensin arterial. SNTOMAS   Dolor en uno o ambos lados de la cabeza.  Dolor pulsante o punzante.  Dolor intenso que impide Ameren Corporationrealizar las actividades diarias.  Dolor que se agrava por cualquier actividad fsica.  Nuseas, vmitos o ambos.  Mareos.  Dolor con la exposicin a las luces brillantes, a los ruidos fuertes o la Bigelow Cornersactividad.  Sensibilidad general a las luces brillantes, a los ruidos fuertes o a los Limited Brandsolores. Antes de sufrir  Caroleen Hamman, puede recibir seales de advertencia (aura). Un aura puede incluir:  Ver las luces intermitentes.  Ver  puntos brillantes, halos o lneas en zigzag.  Tener una visin en tnel o visin borrosa.  Sensacin de entumecimiento u hormigueo.  Dificultad para hablar  Debilidad muscular. DIAGNSTICO  La cefalea migraosa se diagnostica basndose en:  Sntomas.  Examen fsico.  Neomia Dear TC (tomografa computada) o resonancia magntica de la cabeza. Estos estudios de diagnstico por imgenes no pueden diagnosticar las migraas pero pueden ayudar a Sales promotion account executive otras causas de cefalea. TRATAMIENTO  Le prescribirn medicamentos para Engineer, materials y las nuseas. Tambin podrn administrarse medicamentos para ayudar a Armed forces training and education officer. INSTRUCCIONES PARA EL CUIDADO EN EL HOGAR  Slo tome medicamentos de venta libre o recetados para Primary school teacher o Environmental health practitioner, segn las indicaciones de su mdico. No se recomienda usar los opiceos a Air cabin crew.  Cuando tenga la migraa, acustese en un cuarto oscuro y tranquilo  Lleve un registro diario para Financial risk analyst lo que puede provocar las Soil scientist. Por ejemplo, escriba:  Lo que usted come y bebe.  Cunto tiempo duerme.  Algn cambio en su dieta o en los medicamentos.  Limite el consumo de bebidas alcohlicas.  Si fuma, deje de hacerlo.  Duerma entre 7 y 9horas, o segn las recomendaciones del mdico.  Limite el estrs.  Mantenga las luces tenues si le Goodrich Corporation luces brillantes y la [place]. SOLICITE ATENCIN MDICA SI:   No obtiene alivio con los [employer] Health han indicado.  Tiene recurrencia del dolor.  Tiene fiebre. SOLICITE ATENCIN MDICA DE INMEDIATO SI:  La migraa se hace cada vez ms intensa.  Presenta rigidez en el cuello.  Sufre prdida de la visin.  Presenta debilidad muscular o prdida del control muscular.  Comienza a perder el equilibrio o tiene problemas para caminar.  Sufre mareos o se desmaya.  Tiene sntomas graves que son diferentes a los primeros sntomas. ASEGRESE  DE QUE:   Comprende estas instrucciones.  Controlar su afeccin.  Recibir ayuda de inmediato si no mejora o si empeora. Document Released: 09/20/2005 Document Revised: 09/25/2013 [facility] Patient Information 2015 [place], [place]. This information is not intended to replace advice given to you by your health care provider. Make sure you discuss any questions you have with your health care provider.  [facility] Ob/Gyn Providers   Francoise Ceo      Phone: [phone]  [facility]     Phone: [phone]  [facility] [place]  Phone: [phone]  [facility] [place]  Phone: [phone]  [facility]    Phone: [phone]   [facility] [place])    Phone: [phone]  [facility]    Phone: [phone]  [facility]    Phone: [phone]  [facility]    Phone: [phone]  [facility] Phone: [phone]   [facility]  Phone: [phone]  [facility]    Phone: [phone]  [facility]   Phone: [phone]  [facility]      Phone: [phone]  [facility]    Phone: [phone]  [facility]     Phone: [phone]

## 2015-02-05 LAB — GC/CHLAMYDIA PROBE AMP (~~LOC~~) NOT AT ARMC
CHLAMYDIA, DNA PROBE: NEGATIVE
NEISSERIA GONORRHEA: NEGATIVE

## 2015-02-05 LAB — HIV ANTIBODY (ROUTINE TESTING W REFLEX): HIV SCREEN 4TH GENERATION: NONREACTIVE

## 2015-04-03 LAB — OB RESULTS CONSOLE RPR: RPR: NONREACTIVE

## 2015-04-03 LAB — OB RESULTS CONSOLE HEPATITIS B SURFACE ANTIGEN: Hepatitis B Surface Ag: NEGATIVE

## 2015-04-03 LAB — OB RESULTS CONSOLE HIV ANTIBODY (ROUTINE TESTING): HIV: NONREACTIVE

## 2015-04-03 LAB — OB RESULTS CONSOLE ANTIBODY SCREEN: ANTIBODY SCREEN: NEGATIVE

## 2015-04-03 LAB — OB RESULTS CONSOLE RUBELLA ANTIBODY, IGM: Rubella: IMMUNE

## 2015-04-03 LAB — OB RESULTS CONSOLE GC/CHLAMYDIA
CHLAMYDIA, DNA PROBE: NEGATIVE
GC PROBE AMP, GENITAL: NEGATIVE

## 2015-04-03 LAB — OB RESULTS CONSOLE ABO/RH: RH Type: POSITIVE

## 2015-04-04 ENCOUNTER — Other Ambulatory Visit (HOSPITAL_COMMUNITY): Payer: Self-pay | Admitting: Nurse Practitioner

## 2015-04-04 DIAGNOSIS — O418X90 Other specified disorders of amniotic fluid and membranes, unspecified trimester, not applicable or unspecified: Secondary | ICD-10-CM

## 2015-04-04 DIAGNOSIS — O468X9 Other antepartum hemorrhage, unspecified trimester: Principal | ICD-10-CM

## 2015-04-18 ENCOUNTER — Ambulatory Visit (HOSPITAL_COMMUNITY)
Admission: RE | Admit: 2015-04-18 | Discharge: 2015-04-18 | Disposition: A | Discharge status: Home / Self Care | Payer: Self-pay | Source: Ambulatory Visit | Attending: Nurse Practitioner | Admitting: Nurse Practitioner

## 2015-04-18 DIAGNOSIS — Z3689 Encounter for other specified antenatal screening: Secondary | ICD-10-CM | POA: Insufficient documentation

## 2015-04-18 DIAGNOSIS — Z3A19 19 weeks gestation of pregnancy: Secondary | ICD-10-CM | POA: Insufficient documentation

## 2015-04-18 DIAGNOSIS — Z3A00 Weeks of gestation of pregnancy not specified: Secondary | ICD-10-CM | POA: Insufficient documentation

## 2015-04-18 DIAGNOSIS — Z3A18 18 weeks gestation of pregnancy: Secondary | ICD-10-CM | POA: Insufficient documentation

## 2015-04-18 DIAGNOSIS — O468X9 Other antepartum hemorrhage, unspecified trimester: Secondary | ICD-10-CM | POA: Insufficient documentation

## 2015-04-18 DIAGNOSIS — O418X90 Other specified disorders of amniotic fluid and membranes, unspecified trimester, not applicable or unspecified: Secondary | ICD-10-CM

## 2015-08-19 LAB — OB RESULTS CONSOLE GC/CHLAMYDIA
CHLAMYDIA, DNA PROBE: NEGATIVE
GC PROBE AMP, GENITAL: NEGATIVE

## 2015-08-19 LAB — OB RESULTS CONSOLE GBS: GBS: NEGATIVE

## 2015-09-15 ENCOUNTER — Telehealth (HOSPITAL_COMMUNITY): Payer: Self-pay | Admitting: *Deleted

## 2015-09-15 ENCOUNTER — Encounter (HOSPITAL_COMMUNITY): Payer: Self-pay | Admitting: *Deleted

## 2015-09-15 ENCOUNTER — Other Ambulatory Visit (HOSPITAL_COMMUNITY): Payer: Self-pay | Admitting: Physician Assistant

## 2015-09-15 DIAGNOSIS — Z3689 Encounter for other specified antenatal screening: Secondary | ICD-10-CM

## 2015-09-15 NOTE — Telephone Encounter (Signed)
Preadmission screen  

## 2015-09-16 ENCOUNTER — Other Ambulatory Visit (HOSPITAL_COMMUNITY): Payer: Self-pay | Admitting: Physician Assistant

## 2015-09-16 ENCOUNTER — Ambulatory Visit (HOSPITAL_COMMUNITY)
Admission: RE | Admit: 2015-09-16 | Discharge: 2015-09-16 | Disposition: A | Discharge status: Home / Self Care | Payer: Self-pay | Source: Ambulatory Visit | Attending: Physician Assistant | Admitting: Physician Assistant

## 2015-09-16 DIAGNOSIS — O480 Post-term pregnancy: Secondary | ICD-10-CM | POA: Insufficient documentation

## 2015-09-16 DIAGNOSIS — Z3A40 40 weeks gestation of pregnancy: Secondary | ICD-10-CM

## 2015-09-17 ENCOUNTER — Encounter (HOSPITAL_COMMUNITY): Payer: Self-pay | Admitting: *Deleted

## 2015-09-17 NOTE — Telephone Encounter (Signed)
Interpreter number [phone]

## 2015-09-18 NOTE — H&P (Signed)
Lisa Leach is a 24 y.o. female [phone] with IUP at 41w presenting for IOL for postdates However, she has been having contractions for several hours. Does not want any pain medicine.  [facility] at [facility] since 16 wks.    Prenatal History/Complications:  SVD X 2 without problems. Size<dates, but pt declined [place] d/t cost Pt is illiterate   Past Medical History: Past Medical History  Diagnosis Date  . Anemia   . Literacy problem   . Vaginal Pap smear, abnormal     Past Surgical History: No past surgical history on file.  Obstetrical History: OB History    Gravida Para Term Preterm AB TAB SAB Ectopic Multiple Living   Social History: Social History   Social History  . Marital Status: Single    Spouse Name: N/A  . Number of Children: N/A  . Years of Education: N/A   Social History Main Topics  . Smoking status: Former Games developer  . Smokeless tobacco: Never Used  . Alcohol Use: No  . Drug Use: No  . Sexual Activity: Yes    Birth Control/ Protection: None   Other Topics Concern  . Not on file   Social History Narrative    Family History: Family History  Problem Relation Age of Onset  . Diabetes Maternal Grandmother   . Alcohol abuse Neg Hx   . Arthritis Neg Hx   . Asthma Neg Hx   . Birth defects Neg Hx   . Cancer Neg Hx   . COPD Neg Hx   . Depression Neg Hx   . Drug abuse Neg Hx   . Early death Neg Hx   . Hearing loss Neg Hx   . Heart disease Neg Hx   . Hyperlipidemia Neg Hx   . Hypertension Neg Hx   . Kidney disease Neg Hx   . Learning disabilities Neg Hx   . Mental illness Neg Hx   . Mental retardation Neg Hx   . Miscarriages / Stillbirths Neg Hx   . Stroke Neg Hx   . Vision loss Neg Hx   . Varicose Veins Neg Hx     Allergies: No Known Allergies    Prenatal Transfer Tool  Maternal Diabetes: No Genetic Screening: Normal Maternal Ultrasounds/Referrals: Normal Fetal Ultrasounds or other Referrals:   None Maternal Substance Abuse:  No Significant Maternal Medications:  None Significant Maternal Lab Results: Lab values include: Group B Strep negative     Review of Systems   Constitutional: Negative for fever and chills Eyes: Negative for visual disturbances Respiratory: Negative for shortness of breath, dyspnea Cardiovascular: Negative for chest pain or palpitations  Gastrointestinal: Negative for vomiting, diarrhea and constipation.  POSITIVE for abdominal pain (contractions) Genitourinary: Negative for dysuria and urgency Musculoskeletal: Negative for back pain, joint pain, myalgias  Neurological: Negative for dizziness and headaches      Last menstrual period 11/25/2014. General appearance: alert, cooperative and no distress Lungs: clear to auscultation bilaterally Heart: regular rate and rhythm Abdomen: soft, non-tender; bowel sounds normal Pelvic: 6/100/-1 Extremities: Homans sign is negative, no sign of DVT DTR's 2+ Presentation: cephalic Fetal monitoring  Baseline: 140 bpm, Variability: Good {> 6 bpm), Accelerations: Reactive and Decelerations: Absent Uterine activity  q 3 minutes  Prenatal labs: ABO, Rh: A/Positive/-- (06/30 0000) Antibody: Negative (06/30 0000) Rubella: !Error!immune RPR: Nonreactive (06/30 0000)  HBsAg: Negative (06/30 0000)  HIV: Non-reactive (06/30  0000)  GBS: Negative (11/15 0000)  1 hr Glucola 119 Genetic screening  Normal AFP Anatomy US normal   No results found for this or any previous visit (from the past 24 hour(s)).  Assessment: Lisa Leach is a 24 y.o. [phone] with an IUP at 41w presenting for IOL for postdates  Plan: #Labor: expectant management #Pain:  Per request #FWB Cat 1 #ID: GBS: neg  #MOF:  Br and Bottle    CRESENZO-DISHMAN,Veyda Kaufman 09/19/2015, 8:07 AM

## 2015-09-19 ENCOUNTER — Encounter (HOSPITAL_COMMUNITY): Payer: Self-pay

## 2015-09-19 ENCOUNTER — Inpatient Hospital Stay (HOSPITAL_COMMUNITY)
Admission: RE | Admit: 2015-09-19 | Discharge: 2015-09-21 | DRG: 775 | Disposition: A | Discharge status: Home / Self Care | Payer: Medicaid Other | Source: Ambulatory Visit | Attending: Family Medicine | Admitting: Family Medicine

## 2015-09-19 DIAGNOSIS — Z833 Family history of diabetes mellitus: Secondary | ICD-10-CM | POA: Diagnosis not present

## 2015-09-19 DIAGNOSIS — Z87891 Personal history of nicotine dependence: Secondary | ICD-10-CM

## 2015-09-19 DIAGNOSIS — O480 Post-term pregnancy: Secondary | ICD-10-CM | POA: Diagnosis present

## 2015-09-19 DIAGNOSIS — Z3A41 41 weeks gestation of pregnancy: Secondary | ICD-10-CM

## 2015-09-19 DIAGNOSIS — O418X90 Other specified disorders of amniotic fluid and membranes, unspecified trimester, not applicable or unspecified: Secondary | ICD-10-CM | POA: Diagnosis present

## 2015-09-19 DIAGNOSIS — O468X9 Other antepartum hemorrhage, unspecified trimester: Secondary | ICD-10-CM

## 2015-09-19 LAB — HIV ANTIBODY (ROUTINE TESTING W REFLEX): HIV SCREEN 4TH GENERATION: NONREACTIVE

## 2015-09-19 LAB — CBC
HCT: 35.9 % — ABNORMAL LOW (ref 36.0–46.0)
Hemoglobin: 11.9 g/dL — ABNORMAL LOW (ref 12.0–15.0)
MCH: 27.9 pg (ref 26.0–34.0)
MCHC: 33.1 g/dL (ref 30.0–36.0)
MCV: 84.1 fL (ref 78.0–100.0)
PLATELETS: 179 10*3/uL (ref 150–400)
RBC: 4.27 MIL/uL (ref 3.87–5.11)
RDW: 21.8 % — AB (ref 11.5–15.5)
WBC: 8.9 10*3/uL (ref 4.0–10.5)

## 2015-09-19 LAB — RPR: RPR Ser Ql: NONREACTIVE

## 2015-09-19 MED ORDER — ONDANSETRON HCL 4 MG/2ML IJ SOLN: 4.0000 mg | INTRAMUSCULAR | Status: DC | PRN

## 2015-09-19 MED ORDER — WITCH HAZEL-GLYCERIN EX PADS: 1.0000 "application " | MEDICATED_PAD | CUTANEOUS | Status: DC | PRN

## 2015-09-19 MED ORDER — TETANUS-DIPHTH-ACELL PERTUSSIS 5-2.5-18.5 LF-MCG/0.5 IM SUSP
0.5000 mL | Freq: Once | INTRAMUSCULAR | Status: DC
  Filled 2015-09-19: qty 0.5

## 2015-09-19 MED ORDER — OXYTOCIN BOLUS FROM INFUSION
500.0000 mL | INTRAVENOUS | Status: DC
  Administered 2015-09-19: 500 mL via INTRAVENOUS

## 2015-09-19 MED ORDER — ONDANSETRON HCL 4 MG PO TABS: 4.0000 mg | ORAL_TABLET | ORAL | Status: DC | PRN

## 2015-09-19 MED ORDER — OXYCODONE-ACETAMINOPHEN 5-325 MG PO TABS: 2.0000 | ORAL_TABLET | ORAL | Status: DC | PRN

## 2015-09-19 MED ORDER — SIMETHICONE 80 MG PO CHEW: 80.0000 mg | CHEWABLE_TABLET | ORAL | Status: DC | PRN

## 2015-09-19 MED ORDER — TERBUTALINE SULFATE 1 MG/ML IJ SOLN: 0.2500 mg | Freq: Once | INTRAMUSCULAR | Status: DC | PRN

## 2015-09-19 MED ORDER — IBUPROFEN 600 MG PO TABS
600.0000 mg | ORAL_TABLET | Freq: Four times a day (QID) | ORAL | Status: DC
  Administered 2015-09-19 – 2015-09-21 (×5): 600 mg via ORAL
  Filled 2015-09-19 (×7): qty 1

## 2015-09-19 MED ORDER — BENZOCAINE-MENTHOL 20-0.5 % EX AERO
1.0000 "application " | INHALATION_SPRAY | CUTANEOUS | Status: DC | PRN
  Administered 2015-09-20: 1 via TOPICAL
  Filled 2015-09-19 (×2): qty 56

## 2015-09-19 MED ORDER — OXYCODONE-ACETAMINOPHEN 5-325 MG PO TABS: 1.0000 | ORAL_TABLET | ORAL | Status: DC | PRN

## 2015-09-19 MED ORDER — PRENATAL MULTIVITAMIN CH
1.0000 | ORAL_TABLET | Freq: Every day | ORAL | Status: DC
  Administered 2015-09-20: 1 via ORAL
  Filled 2015-09-19 (×2): qty 1

## 2015-09-19 MED ORDER — CITRIC ACID-SODIUM CITRATE 334-500 MG/5ML PO SOLN: 30.0000 mL | ORAL | Status: DC | PRN

## 2015-09-19 MED ORDER — SENNOSIDES-DOCUSATE SODIUM 8.6-50 MG PO TABS
2.0000 | ORAL_TABLET | ORAL | Status: DC
  Administered 2015-09-19 – 2015-09-20 (×2): 2 via ORAL
  Filled 2015-09-19 (×2): qty 2

## 2015-09-19 MED ORDER — OXYTOCIN 40 UNITS IN LACTATED RINGERS INFUSION - SIMPLE MED
62.5000 mL/h | INTRAVENOUS | Status: DC
  Administered 2015-09-19: 62.5 mL/h via INTRAVENOUS
  Filled 2015-09-19: qty 1000

## 2015-09-19 MED ORDER — LIDOCAINE HCL (PF) 1 % IJ SOLN
30.0000 mL | INTRAMUSCULAR | Status: DC | PRN
  Filled 2015-09-19: qty 30

## 2015-09-19 MED ORDER — OXYTOCIN 40 UNITS IN LACTATED RINGERS INFUSION - SIMPLE MED: 1.0000 m[IU]/min | INTRAVENOUS | Status: DC

## 2015-09-19 MED ORDER — ZOLPIDEM TARTRATE 5 MG PO TABS: 5.0000 mg | ORAL_TABLET | Freq: Every evening | ORAL | Status: DC | PRN

## 2015-09-19 MED ORDER — DIPHENHYDRAMINE HCL 25 MG PO CAPS
25.0000 mg | ORAL_CAPSULE | Freq: Four times a day (QID) | ORAL | Status: DC | PRN
Start: 2015-09-19 — End: 2015-09-21

## 2015-09-19 MED ORDER — MISOPROSTOL 25 MCG QUARTER TABLET: 25.0000 ug | ORAL_TABLET | ORAL | Status: DC | PRN

## 2015-09-19 MED ORDER — LANOLIN HYDROUS EX OINT: TOPICAL_OINTMENT | CUTANEOUS | Status: DC | PRN

## 2015-09-19 MED ORDER — ONDANSETRON HCL 4 MG/2ML IJ SOLN: 4.0000 mg | Freq: Four times a day (QID) | INTRAMUSCULAR | Status: DC | PRN

## 2015-09-19 MED ORDER — LACTATED RINGERS IV SOLN
INTRAVENOUS | Status: DC
  Administered 2015-09-19: 08:00:00 via INTRAVENOUS

## 2015-09-19 MED ORDER — DIBUCAINE 1 % RE OINT
1.0000 "application " | TOPICAL_OINTMENT | RECTAL | Status: DC | PRN
  Filled 2015-09-19: qty 28

## 2015-09-19 MED ORDER — FENTANYL CITRATE (PF) 100 MCG/2ML IJ SOLN: 50.0000 ug | INTRAMUSCULAR | Status: DC | PRN

## 2015-09-19 MED ORDER — ACETAMINOPHEN 325 MG PO TABS
650.0000 mg | ORAL_TABLET | ORAL | Status: DC | PRN
  Administered 2015-09-19: 650 mg via ORAL
  Filled 2015-09-19: qty 2

## 2015-09-19 MED ORDER — LACTATED RINGERS IV SOLN: 500.0000 mL | INTRAVENOUS | Status: DC | PRN

## 2015-09-19 MED ORDER — ACETAMINOPHEN 325 MG PO TABS: 650.0000 mg | ORAL_TABLET | ORAL | Status: DC | PRN

## 2015-09-19 NOTE — Progress Notes (Signed)
Lisa Leach is a 24 y.o. G3P2002 at 850w0d by ultrasound admitted for active labor  Subjective: Pacing in room.  States contractions stronger  Objective: BP 109/69 mmHg  Pulse 82  Temp(Src) 98.6 F (37 C) (Oral)  Resp 20  Ht 5\' 2"  (1.575 m)  Wt 63.957 kg (141 lb)  BMI 25.78 kg/m2  LMP 11/25/2014 (Approximate)      FHT:  FHR: 140 bpm, variability: moderate,  accelerations:  Present,  decelerations:  Absent UC:   regular, every 3-4 minutes SVE:   Dilation: 6 Effacement (%): 100 Station: -1 Exam by:: Cathie BeamsFran Cresenzo-Dishmon, CNM  Labs: Lab Results  Component Value Date   WBC 8.9 09/19/2015   HGB 11.9* 09/19/2015   HCT 35.9* 09/19/2015   MCV 84.1 09/19/2015   PLT 179 09/19/2015    Assessment / Plan: Spontaneous labor, progressing normally  Labor: Progressing normally Preeclampsia:  n/a Fetal Wellbeing:  Category I Pain Control:  Labor support without medications I/D:  n/a Anticipated MOD:  NSVD  Lisa Leach 09/19/2015, 9:14 AM

## 2015-09-20 LAB — TYPE AND SCREEN
ABO/RH(D): A POS
Antibody Screen: NEGATIVE

## 2015-09-20 NOTE — Progress Notes (Signed)
Checked on patients needs.  Ordered patients breakfast. °Spanish Interpreter  °

## 2015-09-20 NOTE — Progress Notes (Signed)
Post Partum Day 1 Subjective: no complaints, up ad lib, voiding, tolerating PO and + flatus  Objective: Blood pressure 98/54, pulse 80, temperature 98.2 F (36.8 C), temperature source Oral, resp. rate 18, height 5\' 2"  (1.575 m), weight 63.957 kg (141 lb), last menstrual period 11/25/2014, SpO2 100 %, unknown if currently breastfeeding.  Physical Exam:  General: alert, cooperative and no distress Lochia: appropriate Uterine Fundus: firm Incision: n/a DVT Evaluation: No evidence of DVT seen on physical exam. Negative Homan's sign. No cords or calf tenderness. No significant calf/ankle edema.   Recent Labs  09/19/15 0755  HGB 11.9*  HCT 35.9*    Assessment/Plan: Plan for discharge tomorrow, Breastfeeding and Contraception IUD   LOS: 1 day   LEFTWICH-KIRBY, Hanin [place] 09/20/2015, 7:46 AM

## 2015-09-20 NOTE — Progress Notes (Signed)
Checked on patients needs. Ordered patients dinner. Spanish Interpreter

## 2015-09-20 NOTE — Lactation Note (Signed)
This note was copied from the chart of Lisa Leach. Lactation Consultation Note      Patient Name: Lisa Leach [id number]'s Date: 09/20/2015 Reason for consult: Initial assessment    With this experienced breast feeding mom and term baby. Mom reports breast feeding going well, latching easily, no discomfort. On exam, mom has evert nipples and easily expressed colostrum. Basic breast feeding teaching done, feeding diary reviewed, lactation services reviewed also. Mom knows to call for questions/concerns.    Maternal Data Formula Feeding for Exclusion: Yes Reason for exclusion: Mother's choice to formula and breast feed on admission Has patient been taught Hand Expression?: Yes Does the patient have breastfeeding experience prior to this delivery?: Yes  Feeding Feeding Type: Breast Fed  LATCH Score/Interventions Latch: Repeated attempts needed to sustain latch, nipple held in mouth throughout feeding, stimulation needed to elicit sucking reflex. Intervention(s): Assist with latch;Adjust position (widen latch)  Audible Swallowing:  (mom has lots of easily expressed colostrum)  Type of Nipple: Everted at rest and after stimulation  Comfort (Breast/Nipple): Soft / non-tender     Hold (Positioning): Assistance needed to correctly position infant at breast and maintain latch. Intervention(s): Position options;Support Pillows;Skin to skin;Breastfeeding basics reviewed  LATCH Score: 8  Lactation Tools Discussed/Used     Consult Status Consult Status: Follow-up Date: 09/21/15 Follow-up type: In-patient    Alfred LevinsLee, Kolson Chovanec Anne 09/20/2015, 2:56 PM

## 2015-09-21 DIAGNOSIS — O480 Post-term pregnancy: Secondary | ICD-10-CM

## 2015-09-21 DIAGNOSIS — Z3A41 41 weeks gestation of pregnancy: Secondary | ICD-10-CM

## 2015-09-21 DIAGNOSIS — Z87891 Personal history of nicotine dependence: Secondary | ICD-10-CM

## 2015-09-21 MED ORDER — IBUPROFEN 600 MG PO TABS: 600.0000 mg | ORAL_TABLET | Freq: Four times a day (QID) | ORAL | Status: DC

## 2015-09-21 NOTE — Discharge Summary (Signed)
OB Discharge Summary     Patient Name: Lisa Leach DOB: Dec 16, 1990 MRN: [id number]  Date of admission: 09/19/2015 Delivering MD: Aviva Signs   Date of discharge: 09/21/2015  Admitting diagnosis: INDUCTION Intrauterine pregnancy: [redacted]w[redacted]d     Secondary diagnosis:  Principal Problem:   NSVD (normal spontaneous vaginal delivery) Active Problems:   Subchorionic bleed   Post term pregnancy over 40 weeks   Obstetrical laceration, first degree  Additional problems: None     Discharge diagnosis: Term Pregnancy Delivered                                                                                                Post partum procedures:none  Augmentation: AROM  Complications: None  Hospital course:  Onset of Labor With Vaginal Delivery     24 y.o. yo G3P3003 at [redacted]w[redacted]d was admitted in Latent Labor on 09/19/2015. Patient had an uncomplicated labor course as follows:  Membrane Rupture Time/Date: 1:24 PM ,09/19/2015   Intrapartum Procedures: Episiotomy: None [1]                                         Lacerations:  Periurethral [8]  Patient had a delivery of a Viable infant. 09/19/2015  Information for the patient's newborn:  Deberah Adolf, Boy Lenya [[id number]]  Delivery Method: Vag-Spont    Pateint had an uncomplicated postpartum course.  She is ambulating, tolerating a regular diet, passing flatus, and urinating well. Patient is discharged home in stable condition on .09/21/2015     Physical exam  Filed Vitals:   09/19/15 2033 09/20/15 0430 09/20/15 1826 09/21/15 0635  BP: 94/56  Pulse: 79 80 76 77  Temp: 97.6 F (36.4 C) 98.2 F (36.8 C) 98.3 F (36.8 C) 98.1 F (36.7 C)  TempSrc: Oral Oral Oral Oral  Resp: Height:      Weight:      SpO2: 99% 100%     General: alert, cooperative and no distress Lochia: appropriate Uterine Fundus: firm Incision: N/A DVT Evaluation: No evidence of DVT seen on physical  exam. Negative Homan's sign. Labs: Lab Results  Component Value Date   WBC 8.9 09/19/2015   HGB 11.9* 09/19/2015   HCT 35.9* 09/19/2015   MCV 84.1 09/19/2015   PLT 179 09/19/2015   CMP Latest Ref Rng 03/03/2013  Glucose 70 - 99 mg/dL 89  BUN 6 - 23 mg/dL 10  Creatinine 6.60 - 6.30 mg/dL 1.60  Sodium 109 - 323 mEq/L 138  Potassium 3.5 - 5.1 mEq/L 3.2(L)  Chloride 96 - 112 mEq/L 107  CO2 19 - 32 mEq/L 21  Calcium 8.4 - 10.5 mg/dL 7.9(L)  Total Protein 6.0 - 8.3 g/dL 5.5(D)  Total Bilirubin 0.3 - 1.2 mg/dL 3.2(K)  Alkaline Phos 39 - 117 U/L 51  AST 0 - 37 U/L 14  ALT 0 - 35 U/L 10    Discharge instruction: per After Visit Summary and "Baby and Me Booklet".  After visit meds:    Medication List    TAKE these medications        acetaminophen 500 MG tablet  Commonly known as:  TYLENOL  Take 2 tablets (1,000 mg total) by mouth every 6 (six) hours as needed for mild pain.     ibuprofen 600 MG tablet  Commonly known as:  ADVIL,MOTRIN  Take 1 tablet (600 mg total) by mouth every 6 (six) hours.     traMADol 50 MG tablet  Commonly known as:  ULTRAM  Take 1-2 tablets (50-100 mg total) by mouth every 6 (six) hours as needed for severe pain.      ASK your doctor about these medications        prenatal multivitamin Tabs tablet  Take 1 tablet by mouth daily at 12 noon.        Diet: routine diet  Activity: Advance as tolerated. Pelvic rest for 6 weeks.   Outpatient follow up:6 weeks  Postpartum contraception: IUD Mirena  Newborn Data: Live born female  Birth Weight: 7 lb 9.7 oz (3450 g) APGAR: 9, 9  Baby Feeding: Breast Disposition:home with mother   09/21/2015 Federico FlakeKimberly Niles Newton, MD

## 2015-09-21 NOTE — Discharge Instructions (Signed)

## 2015-09-21 NOTE — Lactation Note (Signed)
This note was copied from the chart of [place]. Lactation Consultation Note  Patient Name: Lisa Leach [id number] Date: 09/21/2015 Reason for consult: Follow-up assessment;Hyperbilirubinemia  Baby 82 hours old. Assisted by [employer]. Mom attempting to latch baby in cross cradle position, but baby not wanting to latch well. Assisted mom to latch baby to left breast in football position. Mom had complained of sore nipples. Mom reports football more comfortable, and baby able to achieve and maintain a deeper latch. Baby suckled rhythmically with some swallows noted. Baby somewhat sleepy at breast, so demonstrated to mom how to stimulate baby to keep suckling. Discussed the how elevated bilirubin causes sleepiness, and demonstrated stimulation techniques to keep baby suckling. Discussed need to nurse with cues and wake if baby not cueing by 3 hours. Enc parents to continue to supplement with EBM/formula as needed, and mom aware of how to use piston from her pumping kit to pump both breasts simultaneously.   Referred parents to Baby and Me booklet for number of diapers to expect by day of life and EBM storage guidelines. Parents aware of OP/BFSG and [place] phone line assistance after D/C.  Maternal Data    Feeding Feeding Type: Breast Fed Length of feed:  (LC assessed first 20 minutes of BF. )  LATCH Score/Interventions Latch: Grasps breast easily, tongue down, lips flanged, rhythmical sucking. Intervention(s): Skin to skin;Teach feeding cues;Waking techniques Intervention(s): Adjust position;Assist with latch;Breast compression  Audible Swallowing: A few with stimulation Intervention(s): Skin to skin;Hand expression  Type of Nipple: Everted at rest and after stimulation  Comfort (Breast/Nipple): Soft / non-tender     Hold (Positioning): Assistance needed to correctly position infant at breast and maintain latch. Intervention(s):  Breastfeeding basics reviewed;Support Pillows;Position options;Skin to skin  LATCH Score: 8  Lactation Tools Discussed/Used Breast pump type: Double-Electric Breast Pump WIC Program: Yes   Consult Status Consult Status: PRN    Inocente Salles 09/21/2015, 8:53 AM

## 2015-09-22 ENCOUNTER — Ambulatory Visit: Payer: Self-pay

## 2015-09-22 NOTE — Lactation Note (Addendum)
This note was copied from the chart of Lisa Leach. Lactation Consultation Note  FOB interpreting. Mother recently pumped w/ manual pump and breasts are soft. Baby on double phototherapy.  Suggest she call to help with next feeding. Left LC phone number for her to call.    1100 Mother called to observe feeding.  FOB interpreting. Baby cups tongue and has short posterior lingual frenulum.  Reviewed suck training. Mother's breasts firm, milk dripping.  Hand expressed milk in container to relieve some pressure. Mother latches baby easily in football hold on R side but she states she is too sore at this time to breastfeed. Attempted with increased depth and mother states it hurts. Applied #20NS and baby latched and mother felt improved comfort.  Noted w/ NS that sucks and swallows heard and observed. Baby breastfed for 10 min and fell asleep.  Described to parents jaundice baby sleepy behavior and they have to be proactive with feedings. Undressed baby in crib and changed diaper. Relatched baby on L breast w/ #20NS.   Encouraged post pumping if needed for 5 min and continue to apply ice when breasts are full. Encouraged breast massage to relieve engorgement. Provided mother w/ extra hand pump and NS and breast pads. Suggest they give some pumped breastmilk after feeding if baby is still cueing. Encouraged parents to monitor voids and stools and call if they have further questions.    Patient Name: Lisa Clovis Rileynabel Torres Leach [id number]'s Date: 09/22/2015 Reason for consult: Follow-up assessment   Maternal Data    Feeding Feeding Type: Breast Fed Length of feed: 20 min  LATCH Score/Interventions Latch: Repeated attempts needed to sustain latch, nipple held in mouth throughout feeding, stimulation needed to elicit sucking reflex. Intervention(s): Breast compression;Breast massage;Adjust position;Assist with latch  Audible Swallowing: A few with  stimulation Intervention(s): Hand expression  Type of Nipple: Everted at rest and after stimulation  Comfort (Breast/Nipple): Engorged, cracked, bleeding, large blisters, severe discomfort Problem noted: Engorgment Intervention(s): Reverse pressure;Hand expression;Ice (hand pump prior to latch)     Hold (Positioning): No assistance needed to correctly position infant at breast.  LATCH Score: 6  Lactation Tools Discussed/Used     Consult Status Consult Status: Follow-up Date: 09/23/15 Follow-up type: In-patient    Dahlia ByesBerkelhammer, Ruth [facility] Center[facility]oschen 09/22/2015, 10:02 AM

## 2015-10-17 NOTE — Progress Notes (Signed)
Post discharge chart review completed.  

## 2016-06-29 IMAGING — US US OB COMP LESS 14 WK
1 series · 14 of 28 positions shown · non-contrast
Comparison: None.

CLINICAL DATA: Abdominal pain, pregnant

EXAM:
OBSTETRIC <14 WK US AND TRANSVAGINAL OB US
TECHNIQUE: Both transabdominal and transvaginal ultrasound examinations were
performed for complete evaluation of the gestation as well as the
maternal uterus, adnexal regions, and pelvic cul-de-sac.
Transvaginal technique was performed to assess early pregnancy.

[Series 1: us ob comp less 14 wk · 14 of 67 slices shown]
[im 3/67]
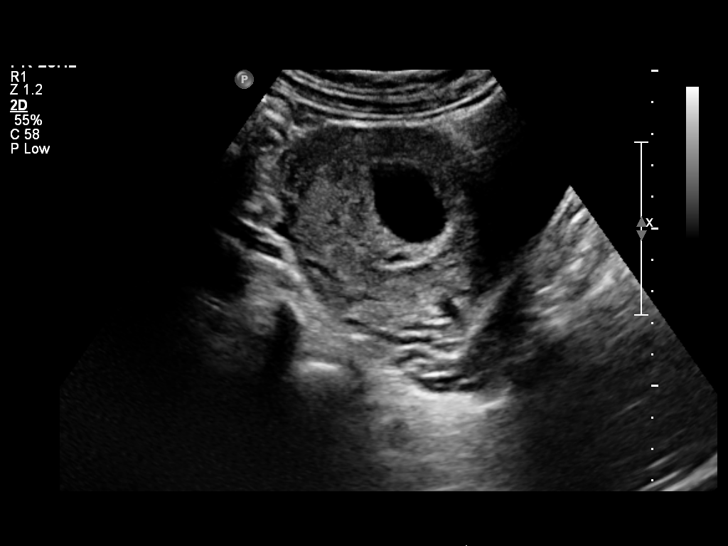
[im 8/67]
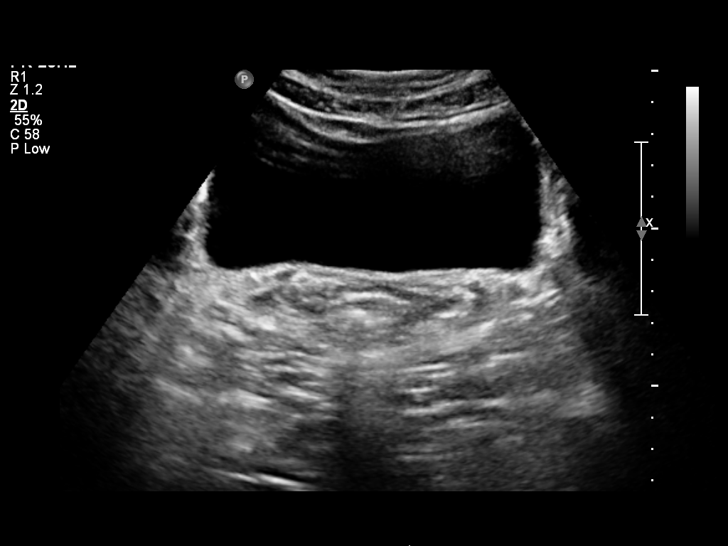
[im 13/67]
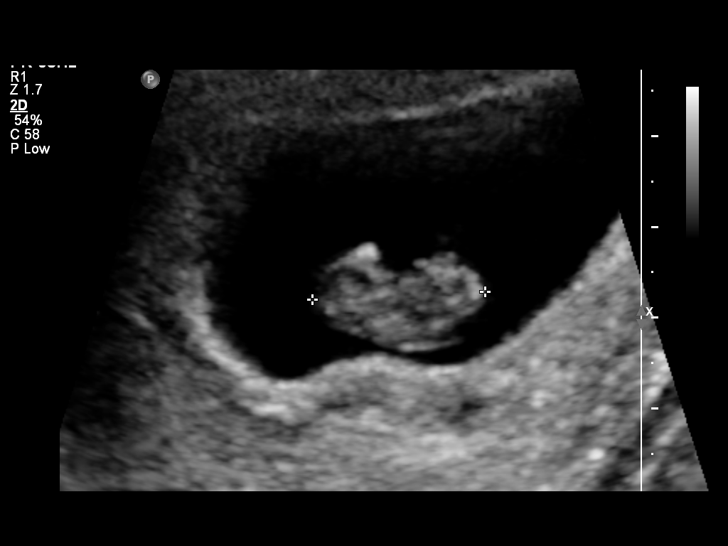
[im 18/67]
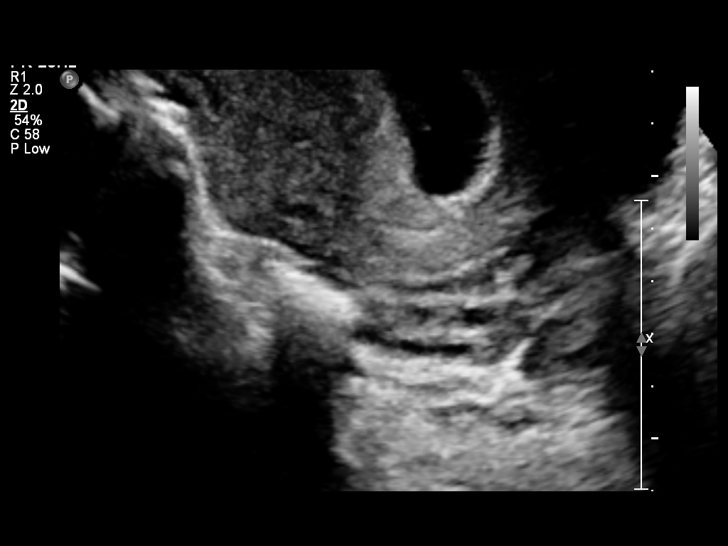
[im 23/67]
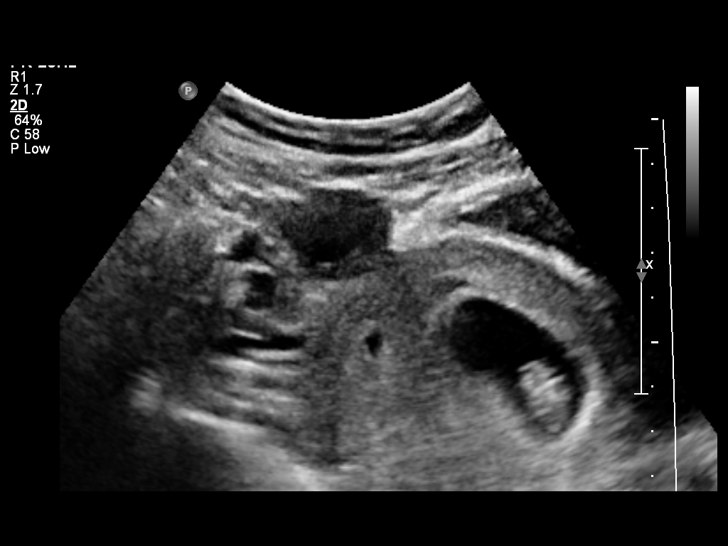
[im 27/67]
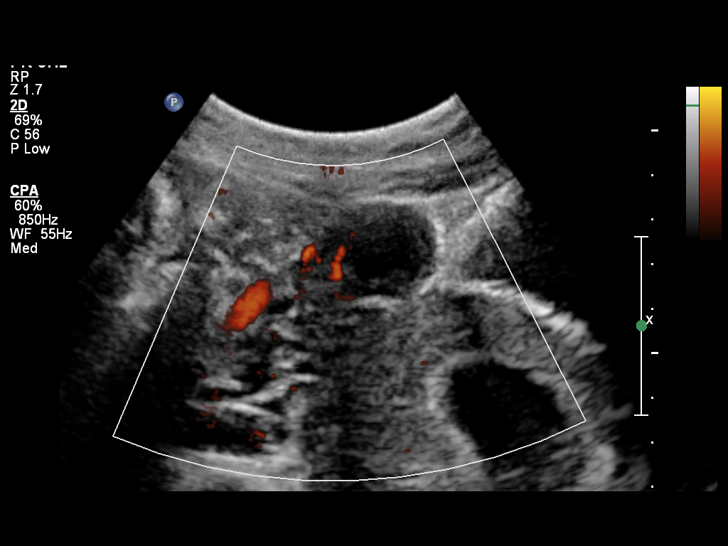
[im 32/67]
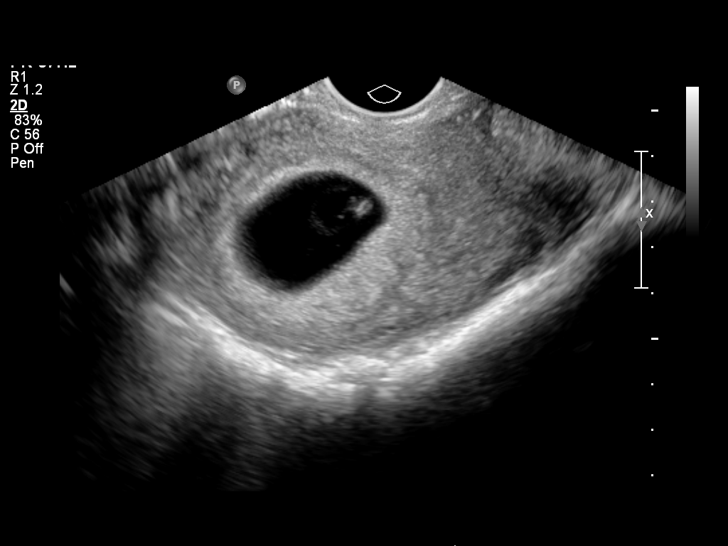
[im 37/67]
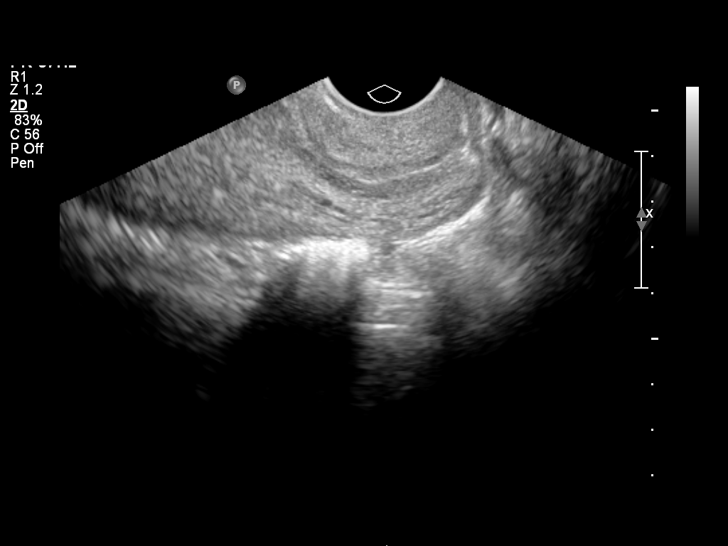
[im 42/67]
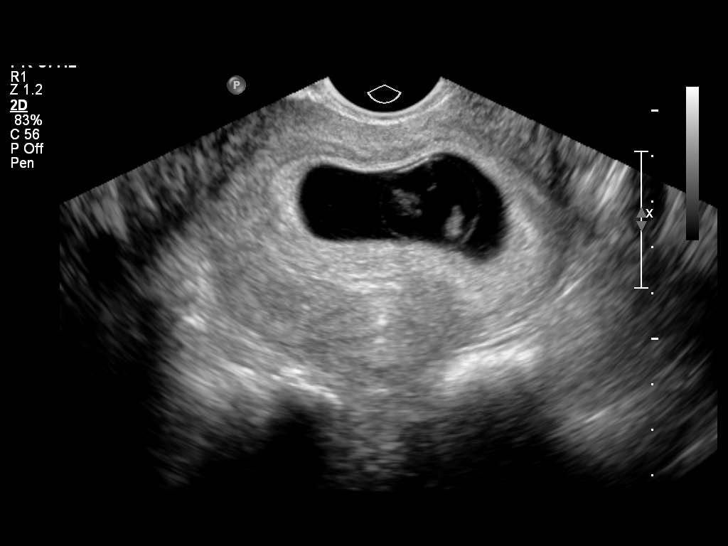
[im 47/67]
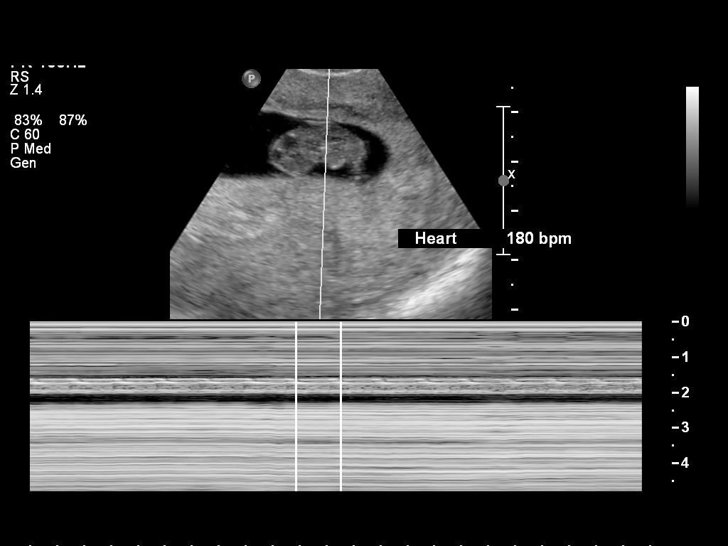
[im 52/67]
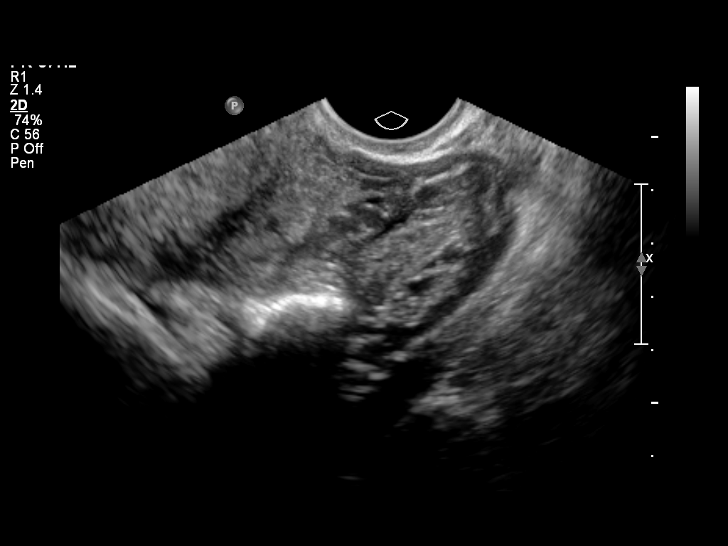
[im 57/67]
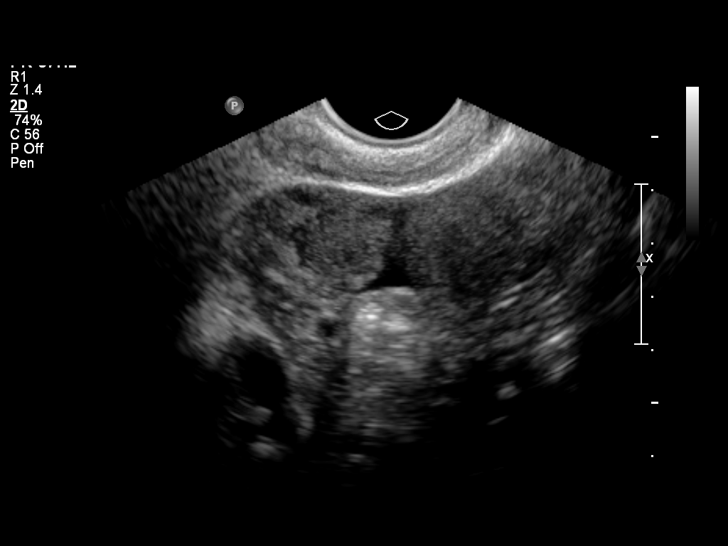
[im 62/67]
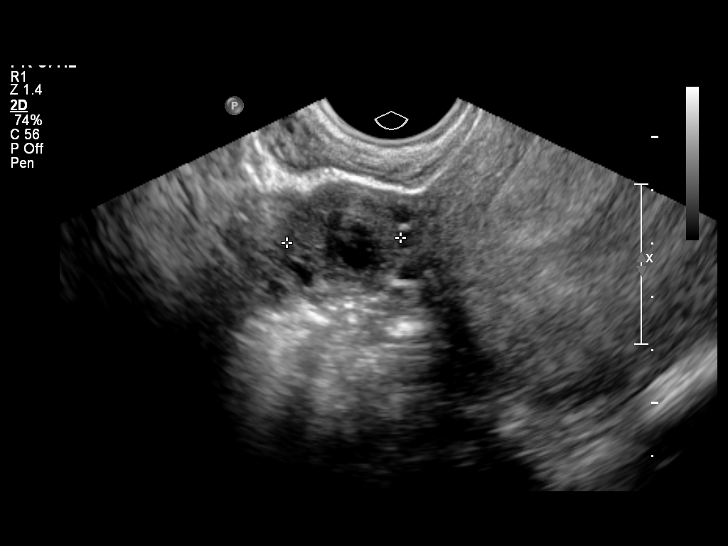
[im 67/67]
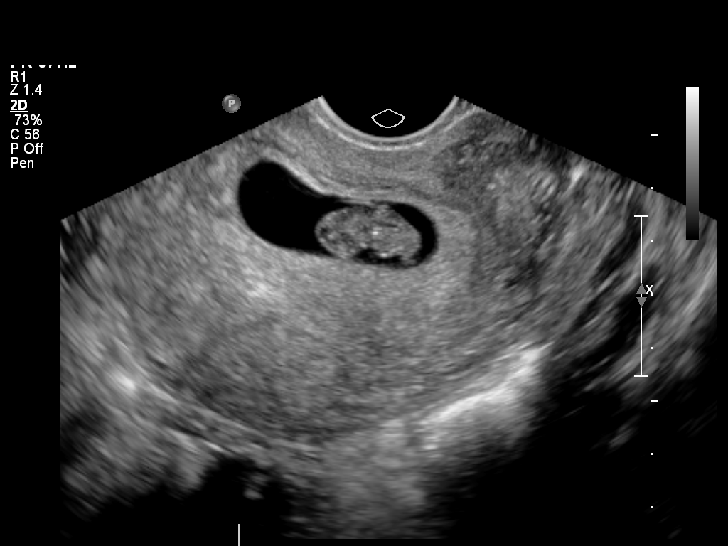

[14 of 28 positions shown; findings below may reference images not displayed]

FINDINGS: Intrauterine gestational sac: Visualized/normal in shape.

Yolk sac:  Present

Embryo:  Present

Cardiac Activity: Present

Heart Rate: 180  bpm

CRL:  19.4  mm   8 w   4 d                  US EDC: 09/12/2015

Maternal uterus/adnexae: Trace pelvic free fluid likely physiologic.
Right ovarian corpus luteum cyst. Ovaries otherwise normal. Small
subchorionic hemorrhage along the right side measuring 1.4 x 0.8 x
0.4 cm.
IMPRESSION: 1. Single live intrauterine pregnancy dating 8 weeks 4 days.
2. Small subchorionic hemorrhage. Attention on follow-up examination
is recommended.

## 2017-01-10 ENCOUNTER — Encounter (INDEPENDENT_AMBULATORY_CARE_PROVIDER_SITE_OTHER): Payer: Self-pay | Admitting: Physician Assistant

## 2017-01-10 ENCOUNTER — Ambulatory Visit (INDEPENDENT_AMBULATORY_CARE_PROVIDER_SITE_OTHER): Payer: Self-pay | Admitting: Physician Assistant

## 2017-01-10 VITALS — BP 100/63 | HR 65 | Temp 97.6°F | Ht 61.81 in | Wt 149.4 lb

## 2017-01-10 DIAGNOSIS — R102 Pelvic and perineal pain: Secondary | ICD-10-CM

## 2017-01-10 DIAGNOSIS — Z0000 Encounter for general adult medical examination without abnormal findings: Secondary | ICD-10-CM

## 2017-01-10 DIAGNOSIS — N920 Excessive and frequent menstruation with regular cycle: Secondary | ICD-10-CM

## 2017-01-10 LAB — POCT URINE PREGNANCY: Preg Test, Ur: NEGATIVE

## 2017-01-10 NOTE — Progress Notes (Signed)
NP presents today for a physical  Pt denies any pain today  Pt states her menstrual cycle is irregular Pt had some spotting last month, that concerned her-not a normal period

## 2017-01-10 NOTE — Patient Instructions (Signed)

## 2017-01-10 NOTE — Progress Notes (Signed)
  Subjective:  Patient ID: Lisa Leach, female    DOB: 1991/08/17  Age: 26 y.o. MRN: [id number]  CC: annual exam  HPI Lisa Leach is a 26 y.o. female with a PMH of abnormal PAP smear presents for annual physical and for complaints of spotting and sharp left sided pelvic pain. Pt complains spotting began one year ago. Thinks perhaps her IUD may be contributing to irregular menses. She was counseled on irregular periods and acknowledges this issue. However, she is concerned about occasional sharp pelvic pains she has at monthly intervals. Would like to have pelvic exam done and to have IUD checked for presence and placement. Denies any other concerns/symptoms.   ROS Review of Systems  Constitutional: Negative for chills, fever and malaise/fatigue.  Eyes: Negative for blurred vision.  Respiratory: Negative for shortness of breath.   Cardiovascular: Negative for chest pain and palpitations.  Gastrointestinal: Negative for abdominal pain and nausea.  Genitourinary: Negative for dysuria and hematuria.       Irregular menstrual spotting  Musculoskeletal: Negative for joint pain and myalgias.  Skin: Negative for rash.  Neurological: Negative for tingling and headaches.  Psychiatric/Behavioral: Negative for depression. The patient is not nervous/anxious.     Objective:  BP 100/63 (BP Location: Left Arm, Patient Position: Sitting, Cuff Size: Normal)   Pulse 65   Temp 97.6 F (36.4 C) (Oral)   Ht 5' 1.81" (1.57 m)   Wt 149 lb 6.4 oz (67.8 kg)   LMP 11/16/2016 (Approximate)   SpO2 98%   Breastfeeding? No   BMI 27.49 kg/m   BP/Weight 01/10/2017 09/21/2015 09/19/2015  Systolic BP 100 94 -  Diastolic BP 63 56 -  Wt. (Lbs) 149.4 - 141  BMI 27.49 - 25.78      Physical Exam No exam conducted. Pt decided to have exam done another day.  Assessment & Plan:     1. Spotting - likely from IUD use - POCT urine pregnancy negative  2. Pelvic pain - Likely  Mittelshmerz pain. Advised to take Naproxen  BID three days before expected pains and a few days after. - Pt would like to wait on pelvic US until she completes [employer] and [employer]  3. Annual physical exam - pt chooses to forego testing and exam today due to not knowing how much testing will cost.     Follow-up: Return if symptoms worsen or fail to improve.   Loletta Specter PA

## 2017-01-21 ENCOUNTER — Ambulatory Visit (INDEPENDENT_AMBULATORY_CARE_PROVIDER_SITE_OTHER): Payer: Self-pay

## 2017-01-26 ENCOUNTER — Ambulatory Visit: Payer: Self-pay | Attending: Internal Medicine

## 2017-03-29 ENCOUNTER — Encounter (INDEPENDENT_AMBULATORY_CARE_PROVIDER_SITE_OTHER): Payer: Self-pay | Admitting: Physician Assistant

## 2017-03-29 ENCOUNTER — Ambulatory Visit (INDEPENDENT_AMBULATORY_CARE_PROVIDER_SITE_OTHER): Payer: Self-pay | Admitting: Physician Assistant

## 2017-03-29 VITALS — BP 127/75 | HR 107 | Temp 101.5°F | Wt 146.4 lb

## 2017-03-29 DIAGNOSIS — R6883 Chills (without fever): Secondary | ICD-10-CM

## 2017-03-29 DIAGNOSIS — M545 Low back pain, unspecified: Secondary | ICD-10-CM

## 2017-03-29 DIAGNOSIS — N3001 Acute cystitis with hematuria: Secondary | ICD-10-CM

## 2017-03-29 LAB — POCT URINALYSIS DIPSTICK
BILIRUBIN UA: NEGATIVE
GLUCOSE UA: NEGATIVE
KETONES UA: NEGATIVE
Nitrite, UA: NEGATIVE
PROTEIN UA: 30
Spec Grav, UA: 1.015 (ref 1.010–1.025)
Urobilinogen, UA: 1 E.U./dL
pH, UA: 8.5 — AB (ref 5.0–8.0)

## 2017-03-29 LAB — POCT URINE PREGNANCY: Preg Test, Ur: NEGATIVE

## 2017-03-29 MED ORDER — CIPROFLOXACIN HCL 500 MG PO TABS: 500.0000 mg | ORAL_TABLET | Freq: Two times a day (BID) | ORAL | 0 refills | Status: AC

## 2017-03-29 NOTE — Progress Notes (Signed)
Subjective:  Patient ID: Lisa Leach, Lisa Leach    DOB: 05/03/1991  Age: 26 y.o. MRN: [id number]  CC: Headache, back pain  HPI Lisa Leach is a 26 y.o. Lisa Leach with no significant PMH presents with mid back pain, urinary frequency, dysuria, headache, fever, and chills. Onset of symptoms 4-5 days ago with progression of symptoms over the last 24 hours. Has not taken anything for relief. Denies pregnancy or any other complaints/symptoms.     Outpatient Medications Prior to Visit  Medication Sig Dispense Refill  . acetaminophen (TYLENOL) 500 MG tablet Take 2 tablets (1,000 mg total) by mouth every 6 (six) hours as needed for mild pain. 30 tablet 0  . Prenatal Vit-Fe Fumarate-FA (PRENATAL MULTIVITAMIN) TABS tablet Take 1 tablet by mouth daily at 12 noon.     No facility-administered medications prior to visit.      ROS Review of Systems  Constitutional: Positive for chills and fever. Negative for malaise/fatigue.  Eyes: Negative for blurred vision.  Respiratory: Negative for shortness of breath.   Cardiovascular: Negative for chest pain and palpitations.  Gastrointestinal: Negative for abdominal pain and nausea.  Genitourinary: Positive for dysuria, frequency and urgency. Negative for flank pain and hematuria.  Musculoskeletal: Positive for back pain. Negative for joint pain and myalgias.  Skin: Negative for rash.  Neurological: Positive for headaches. Negative for tingling.  Psychiatric/Behavioral: Negative for depression. The patient is not nervous/anxious.     Objective:  BP 127/75 (BP Location: Right Arm, Patient Position: Sitting, Cuff Size: Normal)   Pulse (!) 107   Temp (!) 101.5 F (38.6 C) (Oral)   Wt 146 lb 6.4 oz (66.4 kg)   SpO2 100%   BMI 26.94 kg/m   BP/Weight 03/29/2017 01/10/2017 09/21/2015  Systolic BP 127 100 94  Diastolic BP 75 63 56  Wt. (Lbs) 146.4 149.4 -  BMI 26.94 27.49 -      Physical Exam  Constitutional: She is oriented to  person, place, and time.  Well developed, well nourished, NAD, polite  HENT:  Head: Normocephalic and atraumatic.  Eyes: No scleral icterus.  Cardiovascular: Normal rate, regular rhythm and normal heart sounds.   Pulmonary/Chest: Effort normal and breath sounds normal.  Abdominal: Soft. Bowel sounds are normal. There is no tenderness.  Musculoskeletal: She exhibits no edema.  Right sided CVA tenderness  Neurological: She is alert and oriented to person, place, and time. No cranial nerve deficit. Coordination normal.  Skin: Skin is warm and dry. No rash noted. No erythema. No pallor.  Psychiatric: She has a normal mood and affect. Her behavior is normal. Thought content normal.  Vitals reviewed.    Assessment & Plan:   1. Acute cystitis with hematuria - I have specifically instructed patient on pyelonephritis and to go to the ED should her symptoms worsen or persist over the next 12-24 hours.  - Urine Culture - Begin ciprofloxacin (CIPRO) 500 MG tablet; Take 1 tablet (500 mg total) by mouth 2 (two) times daily.  Dispense: 6 tablet; Refill: 0  2. Low back pain without sciatica, unspecified back pain laterality, unspecified chronicity - Urinalysis Dipstick positive for Leukocytes in clinic today - POCT urine pregnancy negative in clinic today.  3. Chills - Urinalysis Dipstick positive for leukocytes in clinic today - POCT urine pregnancy negative in clinic today.   Meds ordered this encounter  Medications  . ciprofloxacin (CIPRO) 500 MG tablet    Sig: Take 1 tablet (500 mg total) by mouth 2 (two) times daily.  Dispense:  6 tablet    Refill:  0    Order Specific Question:   Supervising Provider    Answer:   Quentin Angst [id number]    Follow-up: Return if symptoms worsen or fail to improve.   Loletta Specter PA

## 2017-03-29 NOTE — Patient Instructions (Signed)
Infección de las vías urinarias en los adultos  (Urinary Tract Infection, Adult)  Una infección urinaria (IU) es una infección en cualquier parte de las vías urinarias, que incluyen los riñones, los uréteres, la vejiga y la uretra. Estos órganos fabrican, almacenan y eliminan la orina del organismo. La IU puede ser una infección de la vejiga (cistitis) o infección de los riñones (pielonefritis).  CAUSAS  Esta infección puede ser causada por hongos, virus o bacterias. Las bacterias son las causas más comunes de las IU. Esta afección también puede ser provocada por no vaciar la vejiga por completo durante la micción en repetidas ocasiones.  FACTORES DE RIESGO  Es más probable que esta afección se manifieste si:  · Usted ignora la necesidad de orinar o retiene la orina durante largos períodos.  · No vacía la vejiga completamente durante la micción.  · Se limpia de atrás hacia adelante después de orinar o defecar, en el caso de que sea mujer.  · Está circuncidado, en el caso de que sea varón.  · Tiene estreñimiento.  · Tiene colocada una sonda urinaria permanente.  · Tiene debilitado el sistema de defensa (inmunitario) del cuerpo.  · Tiene una enfermedad que afecta los intestinos, los riñones o la vejiga.  · Tiene diabetes.  · Toma antibióticos con frecuencia o durante largos períodos, y los antibióticos ya no resultan eficaces para combatir algunos tipos de infecciones (resistencia a los antibióticos).  · Toma medicamentos que le irritan las vías urinarias.  · Está expuesto a sustancias químicas que le irritan las vías urinarias.  · Es mujer.  SÍNTOMAS  Los síntomas de esta afección incluyen lo siguiente:  · Fiebre.  · Micción frecuente o eliminación de pequeñas cantidades de orina con frecuencia.  · Necesidad urgente de orinar.  · Ardor o dolor al orinar.  · Orina con mal olor u olor atípico.  · Orina turbia.  · Dolor en la parte baja del abdomen o en la espalda.  · Dificultad para orinar.  · Sangre en la  orina.  · Vómitos o más apetito de lo normal.  · Diarrea o dolor abdominal.  · Secreción vaginal, si es mujer.  DIAGNÓSTICO  Esta afección se diagnostica mediante sus antecedentes médicos y un examen físico. También deberá proporcionar una muestra de orina para realizar análisis. Podrán indicarle otros estudios, por ejemplo:  · Análisis de sangre.  · Pruebas de detección de enfermedades de transmisión sexual (ETS).  Si ha tenido más de una IU, se pueden hacer estudios de diagnóstico por imágenes o una citoscopia para determinar la causa de las infecciones.  TRATAMIENTO  El tratamiento de esta afección suele incluir una combinación de dos o más de los siguientes:  · Antibióticos.  · Otros medicamentos para tratar las causas menos frecuentes de infección urinaria.  · Medicamentos de venta libre para aliviar el dolor.  · Consumo de la cantidad necesaria de agua para mantenerse hidratado.  INSTRUCCIONES PARA EL CUIDADO EN EL HOGAR  · Tome los medicamentos de venta libre y los recetados solamente como se lo haya indicado el médico.  · Si le recetaron un antibiótico, tómelo como se lo haya indicado el médico. No deje de tomar los antibióticos aunque comience a sentirse mejor.  · Evite el alcohol, la cafeína, el té y las bebidas gaseosas. Estas sustancias pueden irritar la vejiga.  · Beba suficiente líquido para mantener la orina clara o de color amarillo pálido.  · Concurra a todas las visitas de control como se   lo haya indicado el médico. Esto es importante.  · Asegúrese de lo siguiente:  ? Vaciar la vejiga con frecuencia y en su totalidad. No contener la orina durante largos períodos.  ? Vaciar la vejiga antes y después de tener relaciones sexuales.  ? Limpiar de adelante hacia atrás después de defecar, si es mujer. Usar cada trozo de papel una vez cuando se limpie.    SOLICITE ATENCIÓN MÉDICA SI:  · Siente dolor en la espalda.  · Tiene fiebre.  · Siente náuseas o vomita.  · Los síntomas no mejoran después de 3 días de  tratamiento.  · Los síntomas desaparecen y luego reaparecen.    SOLICITE ATENCIÓN MÉDICA DE INMEDIATO SI:  · Siente dolor intenso en la espalda o en la zona inferior del abdomen.  · Tiene vómitos y no puede tragar medicamentos ni agua.    Esta información no tiene como fin reemplazar el consejo del médico. Asegúrese de hacerle al médico cualquier pregunta que tenga.  Document Released: 06/30/2005 Document Revised: 01/12/2016 Document Reviewed: 08/11/2015  Elsevier Interactive Patient Education © 2017 Elsevier Inc.

## 2017-03-31 LAB — URINE CULTURE

## 2017-09-05 ENCOUNTER — Ambulatory Visit: Payer: Self-pay | Attending: Physician Assistant

## 2017-09-22 ENCOUNTER — Encounter (INDEPENDENT_AMBULATORY_CARE_PROVIDER_SITE_OTHER): Payer: Self-pay | Admitting: Physician Assistant

## 2017-09-22 ENCOUNTER — Ambulatory Visit (INDEPENDENT_AMBULATORY_CARE_PROVIDER_SITE_OTHER): Payer: Self-pay | Admitting: Physician Assistant

## 2017-09-22 ENCOUNTER — Other Ambulatory Visit: Payer: Self-pay

## 2017-09-22 VITALS — BP 97/54 | HR 59 | Temp 98.1°F | Wt 149.6 lb

## 2017-09-22 DIAGNOSIS — H00015 Hordeolum externum left lower eyelid: Secondary | ICD-10-CM

## 2017-09-22 DIAGNOSIS — K029 Dental caries, unspecified: Secondary | ICD-10-CM

## 2017-09-22 MED ORDER — AMOXICILLIN-POT CLAVULANATE 875-125 MG PO TABS: 1.0000 | ORAL_TABLET | Freq: Two times a day (BID) | ORAL | 0 refills | Status: DC

## 2017-09-22 MED ORDER — ACETAMINOPHEN-CODEINE #3 300-30 MG PO TABS: 1.0000 | ORAL_TABLET | ORAL | 0 refills | Status: AC | PRN

## 2017-09-22 MED ORDER — NAPROXEN 500 MG PO TABS: 500.0000 mg | ORAL_TABLET | Freq: Two times a day (BID) | ORAL | 0 refills | Status: DC

## 2017-09-22 NOTE — Patient Instructions (Addendum)
Por favor marke la direccion [email or web address] para buscar un dentista que la pueda attender.   Orzuelo (Stye) Un orzuelo es un bulto en el prpado causado por una infeccin bacteriana. Puede formarse dentro del prpado (orzuelo interno) o fuera del prpado (orzuelo externo). Un orzuelo interno puede ser causado por una infeccin en una glndula sebcea dentro del prpado. Un orzuelo externo puede estar causado por una infeccin en la base de la pestaa (folculo piloso). Los orzuelos son muy frecuentes. Todas las personas pueden tener orzuelos a Actuarycualquier edad. Suelen ocurrir solo en un ojo, Biomedical engineerpero puede tener ms de Inteluno en los dos ojos. CAUSAS La infeccin casi siempre es causada por una bacteria llamada Staphylococcus aureus, que es un tipo comn de bacteria que vive en la piel. FACTORES DE RIESGO Puede tener un riesgo ms alto de sufrir un orzuelo si ya ha tenido [place]. Tambin puede tener un riesgo ms alto si tiene:  Diabetes.  Una enfermedad crnica.  Enrojecimiento prolongado en los ojos.  Una afeccin cutnea denominada seborrea.  Niveles altos de grasa en la sangre (lpidos). SIGNOS Y SNTOMAS El dolor en el prpado es el sntoma ms frecuente del [place]. Los orzuelos internos son ms dolorosos que los externos. Otros signos y sntomas pueden incluir los siguientes:  Hinchazn dolorosa del prpado.  Sensacin de [employer] Grouppicazn en el ojo.  Lagrimeo y enrojecimiento del ojo.  Pus que drena del orzuelo. DIAGNSTICO Con tan solo examinarle el ojo, el mdico puede diagnosticarle un [place]. Tambin puede revisarlo para asegurarse de que:  No tenga fiebre ni otros signos de una infeccin ms grave.  La infeccin no se haya diseminado a otras partes del ojo o a zonas circundantes. TRATAMIENTO La mayora de los orzuelos desparecen en unos das sin Tolnatratamiento. En algunos casos, puede necesitar antibiticos en gotas o ungento para prevenir la infeccin. Es posible que el mdico deba drenar  el orzuelo por va quirrgica si este:  Es grande.  Causa mucho dolor.  Interfiere con la visin. Esto se puede realizar con un instrumento cortante de hoja delgada o una aguja. INSTRUCCIONES PARA EL CUIDADO EN EL HOGAR  Tome los medicamentos solamente como se lo haya indicado el mdico.  Aplique una compresa limpia y caliente sobre ojo durante 10minutos, 4veces al Futures traderda.  No use lentes de contacto ni maquillaje para los ojos [place] que el orzuelo se haya curado.  No trate de reventar o drenar el orzuelo. SOLICITE ATENCIN MDICA SI:  Tiene escalofros o fiebre.  El orzuelo no desaparece despus de [address]varios das.  El orzuelo afecta la visin.  Comienza a Psychiatristsentir dolor en el globo ocular, o se le hincha o enrojece. ASEGRESE DE QUE:  Comprende estas instrucciones.  Controlar su afeccin.  Recibir ayuda de inmediato si no mejora o si empeora. Esta informacin no tiene Theme park managercomo fin reemplazar el consejo del mdico. Asegrese de hacerle al mdico cualquier pregunta que tenga. Document Released: 06/30/2005 Document Revised: 10/11/2014 Document Reviewed: 01/04/2014 Elsevier Interactive Patient Education  2018 Elsevier Inc.   Dental Caries Dental caries are spots of decay (cavities) in teeth. They are in the outer layer of your tooth (enamel). Treat them as soon as you can. If they are not treated, they can spread decay and lead to painful infection. Follow these instructions at home: General instructions  Take good care of your mouth and teeth. This keeps them healthy. ? Brush your teeth 2 times a day. Use toothpaste with fluoride in it. ? Floss your teeth once a day.  If your dentist prescribed an antibiotic medicine to treat an infection, take it as told. Do not stop taking the antibiotic even if your condition gets better.  Keep all follow-up visits as told by your dentist. This is important. This includes all cleanings. Preventing dental caries  Brush your teeth every  morning and night. Use fluoride toothpaste.  Get regular dental cleanings.  If you are at risk of dental caries. ? Wash your mouth with prescription mouthwash (chlorhexidine). ? Put topical fluoride on your teeth.  Drink water with fluoride in it.  Drink water instead of sugary drinks.  Eat healthy meals and snacks. Contact a doctor if:  You have symptoms of tooth decay. Summary  Dental caries are spots of decay (cavities) in teeth. They are in the outer layer of your tooth.  Take an antibiotic to treat an infection, if told by your dentist. Do not stop taking the antibiotic even if your condition gets better.  Regular dental cleanings and brushing can help prevent dental caries. This information is not intended to replace advice given to you by your health care provider. Make sure you discuss any questions you have with your health care provider. Document Released: 06/29/2008 Document Revised: 06/06/2016 Document Reviewed: 06/06/2016 Elsevier Interactive Patient Education  2017 [employer]

## 2017-09-22 NOTE — Progress Notes (Signed)
Subjective:  Patient ID: Lisa Leach, female    DOB: 11/13/1990  Age: 26 y.o. MRN: [id number]  CC:  Dental pain  HPI Lisa Leach is a 26 y.o. female with no significant medical history presents with tooth pain. Says she has developed waxing and waning tooth pain over the last few months. However, her tooth pain is becoming unbearable and is aggravated frequently with air, food, and liquid. Has tried OTC products for relief with little to no effect. Would like a referral to a dentist. Denies tongue, throat, or facial swelling. No fever, chills, chest pain, or rash.     Has a small bump on the left lower eyelid that appeared one week ago. Has seen an ophthalmologist which recommended warm compress and an antibiotic ointment. He has recommended that she return in 6 weeks. Pt still has bump and would like to know if she can be referred elsewhere to be attended to for free.       Outpatient Medications Prior to Visit  Medication Sig Dispense Refill  . acetaminophen (TYLENOL) 500 MG tablet Take 2 tablets (1,000 mg total) by mouth every 6 (six) hours as needed for mild pain. (Patient not taking: Reported on 09/22/2017) 30 tablet 0  . Prenatal Vit-Fe Fumarate-FA (PRENATAL MULTIVITAMIN) TABS tablet Take 1 tablet by mouth daily at 12 noon.     No facility-administered medications prior to visit.      ROS Review of Systems  Constitutional: Negative for chills, fever and malaise/fatigue.  HENT: Negative for sore throat.   Eyes: Negative for blurred vision, double vision and pain.  Respiratory: Negative for shortness of breath.   Cardiovascular: Negative for chest pain and palpitations.  Gastrointestinal: Negative for abdominal pain and nausea.  Genitourinary: Negative for dysuria and hematuria.  Musculoskeletal: Negative for joint pain and myalgias.  Skin: Negative for rash.  Neurological: Negative for tingling and headaches.  Psychiatric/Behavioral: Negative for  depression. The patient is not nervous/anxious.     Objective:  BP (!) 97/54 (BP Location: Left Arm, Patient Position: Sitting, Cuff Size: Normal)   Pulse (!) 59   Temp 98.1 F (36.7 C) (Oral)   Wt 149 lb 9.6 oz (67.9 kg)   SpO2 100%   BMI 27.53 kg/m   BP/Weight 09/22/2017 03/29/2017 01/10/2017  Systolic BP 97 127 100  Diastolic BP 54 75 63  Wt. (Lbs) 149.6 146.4 149.4  BMI 27.53 26.94 27.49      Physical Exam  Constitutional: She is oriented to person, place, and time.  Well developed, well nourished, NAD, polite  HENT:  Head: Normocephalic and atraumatic.  No facial, tongue, or throat swelling. Cavity at the base of tooth number 27 with tenderness to palpation of the gum. No bleeding, suppuration, or edema.  Eyes: No scleral icterus.  Neck: Normal range of motion. Neck supple. No thyromegaly present.  Cardiovascular: Normal rate, regular rhythm and normal heart sounds.  Pulmonary/Chest: Effort normal and breath sounds normal.  Musculoskeletal: She exhibits no edema.  Lymphadenopathy:    She has no cervical adenopathy.  Neurological: She is alert and oriented to person, place, and time. No cranial nerve deficit. Coordination normal.  Skin: Skin is warm and dry. No rash noted. No erythema. No pallor.  Psychiatric: She has a normal mood and affect. Her behavior is normal. Thought content normal.  Vitals reviewed.    Assessment & Plan:   1. Pain due to dental caries - acetaminophen-codeine (TYLENOL #3) 300-30 MG tablet; Take 1 tablet  by mouth every 4 (four) hours as needed for up to 7 days for moderate pain.  Dispense: 42 tablet; Refill: 0 - naproxen (NAPROSYN) 500 MG tablet; Take 1 tablet (500 mg total) by mouth 2 (two) times daily with a meal.  Dispense: 30 tablet; Refill: 0 - amoxicillin-clavulanate (AUGMENTIN) 875-125 MG tablet; Take 1 tablet by mouth 2 (two) times daily.  Dispense: 20 tablet; Refill: 0 - Ambulatory referral to Dentistry  2. Hordeolum externum of left  lower eyelid - I have advised patient that all our ophthalmologists will still charge her to be seen. It is better that she continue treatment with her original ophthalmologist. Pt agreed.    Meds ordered this encounter  Medications  . acetaminophen-codeine (TYLENOL #3) 300-30 MG tablet    Sig: Take 1 tablet by mouth every 4 (four) hours as needed for up to 7 days for moderate pain.    Dispense:  42 tablet    Refill:  0    Order Specific Question:   Supervising Provider    Answer:   Quentin AngstJEGEDE, OLUGBEMIGA E [id number]  . naproxen (NAPROSYN) 500 MG tablet    Sig: Take 1 tablet (500 mg total) by mouth 2 (two) times daily with a meal.    Dispense:  30 tablet    Refill:  0    Order Specific Question:   Supervising Provider    Answer:   Quentin AngstJEGEDE, OLUGBEMIGA E [id number]  . amoxicillin-clavulanate (AUGMENTIN) 875-125 MG tablet    Sig: Take 1 tablet by mouth 2 (two) times daily.    Dispense:  20 tablet    Refill:  0    Order Specific Question:   Supervising Provider    Answer:   Quentin AngstJEGEDE, OLUGBEMIGA E [id number]    Follow-up: Return if symptoms worsen or fail to improve.   Lisa Specteroger David Gomez PA

## 2017-12-06 ENCOUNTER — Ambulatory Visit: Payer: Self-pay | Attending: Physician Assistant

## 2018-02-01 ENCOUNTER — Ambulatory Visit (INDEPENDENT_AMBULATORY_CARE_PROVIDER_SITE_OTHER): Payer: Self-pay | Admitting: Physician Assistant

## 2018-02-01 ENCOUNTER — Other Ambulatory Visit: Payer: Self-pay

## 2018-02-01 ENCOUNTER — Encounter (INDEPENDENT_AMBULATORY_CARE_PROVIDER_SITE_OTHER): Payer: Self-pay | Admitting: Physician Assistant

## 2018-02-01 VITALS — BP 96/60 | HR 66 | Temp 97.7°F | Ht 62.0 in | Wt 153.8 lb

## 2018-02-01 DIAGNOSIS — T148XXA Other injury of unspecified body region, initial encounter: Secondary | ICD-10-CM

## 2018-02-01 DIAGNOSIS — M25562 Pain in left knee: Secondary | ICD-10-CM

## 2018-02-01 MED ORDER — CYCLOBENZAPRINE HCL 5 MG PO TABS: 5.0000 mg | ORAL_TABLET | Freq: Every day | ORAL | 0 refills | Status: AC

## 2018-02-01 MED ORDER — NAPROXEN 500 MG PO TABS: 500.0000 mg | ORAL_TABLET | Freq: Two times a day (BID) | ORAL | 1 refills | Status: AC

## 2018-02-01 NOTE — Progress Notes (Signed)
Subjective:  Patient ID: Lisa Leach, female    DOB: 12/02/1990  Age: 27 y.o. MRN: [id number]  CC: knee pain  HPI Lisa Leach is a 27 y.o. female with no significant medical history presents with left knee pain after starting exercising one week ago. Doing Zumba for exercise. Pain located in the lateral midline of joint. Applied OTC pain patches and has done much better. Currently with little to no pain.  Also complains of bilaterally upper trapezius pain. Attributed to stress from raising her young child. Does not endorse any other symptoms or complaints.     Outpatient Medications Prior to Visit  Medication Sig Dispense Refill  . amoxicillin-clavulanate (AUGMENTIN) 875-125 MG tablet Take 1 tablet by mouth 2 (two) times daily. 20 tablet 0  . naproxen (NAPROSYN) 500 MG tablet Take 1 tablet (500 mg total) by mouth 2 (two) times daily with a meal. 30 tablet 0  . Prenatal Vit-Fe Fumarate-FA (PRENATAL MULTIVITAMIN) TABS tablet Take 1 tablet by mouth daily at 12 noon.     No facility-administered medications prior to visit.      ROS Review of Systems  Constitutional: Negative for chills, fever and malaise/fatigue.  Eyes: Negative for blurred vision.  Respiratory: Negative for shortness of breath.   Cardiovascular: Negative for chest pain and palpitations.  Gastrointestinal: Negative for abdominal pain and nausea.  Genitourinary: Negative for dysuria and hematuria.  Musculoskeletal: Positive for joint pain and myalgias.  Skin: Negative for rash.  Neurological: Negative for tingling and headaches.  Psychiatric/Behavioral: Negative for depression. The patient is not nervous/anxious.     Objective:  BP 96/60 (BP Location: Left Arm, Patient Position: Sitting, Cuff Size: Normal)   Pulse 66   Temp 97.7 F (36.5 C) (Oral)   Ht  (1.575 m)   Wt 153 lb 12.8 oz (69.8 kg)   SpO2 98%   BMI 28.13 kg/m   BP/Weight 02/01/2018 09/22/2017 03/29/2017  Systolic BP 96 97  127  Diastolic BP 60 54 75  Wt. (Lbs) 153.8 149.6 146.4  BMI 28.13 27.53 26.94      Physical Exam  Constitutional: She is oriented to person, place, and time.  Well developed, well nourished, NAD, polite  HENT:  Head: Normocephalic and atraumatic.  Eyes: No scleral icterus.  Neck: Normal range of motion. Neck supple. No thyromegaly present.  Cardiovascular: Normal rate, regular rhythm and normal heart sounds.  Pulmonary/Chest: Effort normal and breath sounds normal.  Musculoskeletal: She exhibits no edema.  Mild bilateral TTP of the upper trapezius, no spasm. Mild TTP along the lateral aspect of the midline of left knee. Negative anterior/posterior drawer, negative varus/valgus stress testing, negative patellar apprehension.   Neurological: She is alert and oriented to person, place, and time.  Skin: Skin is warm and dry. No rash noted. No erythema. No pallor.  Psychiatric: She has a normal mood and affect. Her behavior is normal. Thought content normal.  Vitals reviewed.    Assessment & Plan:    1. Acute pain of left knee - Reportedly much better since onset. Etiology suspected to be meniscal. - Begin naproxen (NAPROSYN) 500 MG tablet; Take 1 tablet (500 mg total) by mouth 2 (two) times daily with a meal.  Dispense: 30 tablet; Refill: 1  2. Muscle strain - Begin cyclobenzaprine (FLEXERIL) 5 MG tablet; Take 1 tablet (5 mg total) by mouth at bedtime.  Dispense: 10 tablet; Refill: 0   Meds ordered this encounter  Medications  . naproxen (NAPROSYN) 500 MG  tablet    Sig: Take 1 tablet (500 mg total) by mouth 2 (two) times daily with a meal.    Dispense:  30 tablet    Refill:  1    Order Specific Question:   Supervising Provider    Answer:   Quentin Angst [id number]  . cyclobenzaprine (FLEXERIL) 5 MG tablet    Sig: Take 1 tablet (5 mg total) by mouth at bedtime.    Dispense:  10 tablet    Refill:  0    Order Specific Question:   Supervising Provider    Answer:    Quentin Angst [id number]    Follow-up: Return if symptoms worsen or fail to improve.   Loletta Specter PA

## 2018-02-01 NOTE — Patient Instructions (Signed)
Distensin muscular. (Muscle Strain) Una distensin muscular (estiramiento muscular) ocurre cuando un msculo se estira ms all de la longitud normal. Ocurre cuando una fuerza violenta bruscamente estira demasiado el msculo. Generalmente se desgarran algunas de las fibras del msculo. La distensin muscular es comn en los atletas. La recuperacin normalmente tarda de 1 a 2semanas. La curacin completa tarda de 5 a 6semanas. CUIDADOS EN EL HOGAR  Siga el mtodo PRICE (por sus siglas en ingls) de tratamiento para que la lesin mejore. Hgalo durante los 2 a 3 primeros das despus de la lesin: ? Proteccin. Proteja el msculo para evitar que se vuelva a lesionar. ? Reposo. Limite la actividad y descanse la parte del cuerpo lesionada. ? Hielo. Ponga el hielo en una bolsa plstica. Coloque una toalla entre la piel y la bolsa de hielo. Luego aplique el hielo y djelo actuar de 15 a 20minutos por hora. Despus del tercer da, cambie a compresas de calor hmedo. ? Compresin. Use una frula o venda elstica en la zona lesionada para brindar alivio. No la ajuste demasiado. ? Elevacin. Eleve la zona lesionada por encima del nivel del corazn.  Solo tome los medicamentos que le haya indicado su mdico.  Realice un calentamiento antes de hacer ejercicio para prevenir distensiones musculares futuras.  SOLICITE AYUDA SI:  Siente ms dolor o inflamacin (hinchazn) en la zona lesionada.  Siente adormecimiento, hormigueo o nota una prdida de fuerza en la zona lesionada.  ASEGRESE DE QUE:  Comprende estas instrucciones.  Controlar su afeccin.  Recibir ayuda de inmediato si no mejora o si empeora.  Esta informacin no tiene como fin reemplazar el consejo del mdico. Asegrese de hacerle al mdico cualquier pregunta que tenga. Document Released: 12/17/2008 Document Revised: 07/11/2013 Document Reviewed: 04/19/2013 Elsevier Interactive Patient Education  2017 Elsevier Inc.  

## 2018-03-31 ENCOUNTER — Ambulatory Visit: Payer: Self-pay
